# Patient Record
Sex: Female | Born: 1955
Health system: Southern US, Community
[De-identification: ages and names within clinical notes are randomized; demographics above are authoritative.]

## PROBLEM LIST (undated history)

## (undated) DIAGNOSIS — D680 Von Willebrand's disease: Secondary | ICD-10-CM

## (undated) DIAGNOSIS — M810 Age-related osteoporosis without current pathological fracture: Secondary | ICD-10-CM

## (undated) DIAGNOSIS — E079 Disorder of thyroid, unspecified: Secondary | ICD-10-CM

## (undated) DIAGNOSIS — M5126 Other intervertebral disc displacement, lumbar region: Secondary | ICD-10-CM

## (undated) DIAGNOSIS — D649 Anemia, unspecified: Secondary | ICD-10-CM

## (undated) DIAGNOSIS — S42009A Fracture of unspecified part of unspecified clavicle, initial encounter for closed fracture: Secondary | ICD-10-CM

## (undated) DIAGNOSIS — F419 Anxiety disorder, unspecified: Secondary | ICD-10-CM

## (undated) DIAGNOSIS — F32A Depression, unspecified: Secondary | ICD-10-CM

## (undated) DIAGNOSIS — G43909 Migraine, unspecified, not intractable, without status migrainosus: Secondary | ICD-10-CM

## (undated) DIAGNOSIS — S92902A Unspecified fracture of left foot, initial encounter for closed fracture: Secondary | ICD-10-CM

## (undated) DIAGNOSIS — M1610 Unilateral primary osteoarthritis, unspecified hip: Secondary | ICD-10-CM

## (undated) DIAGNOSIS — F329 Major depressive disorder, single episode, unspecified: Secondary | ICD-10-CM

## (undated) DIAGNOSIS — T7840XA Allergy, unspecified, initial encounter: Secondary | ICD-10-CM

## (undated) DIAGNOSIS — R87619 Unspecified abnormal cytological findings in specimens from cervix uteri: Secondary | ICD-10-CM

## (undated) HISTORY — DX: Disorder of thyroid, unspecified: E07.9

## (undated) HISTORY — PX: TONSILLECTOMY AND ADENOIDECTOMY: SHX28

## (undated) HISTORY — DX: Anemia, unspecified: D64.9

## (undated) HISTORY — DX: Unspecified fracture of left foot, initial encounter for closed fracture: S92.902A

## (undated) HISTORY — DX: Major depressive disorder, single episode, unspecified: F32.9

## (undated) HISTORY — DX: Fracture of unspecified part of unspecified clavicle, initial encounter for closed fracture: S42.009A

## (undated) HISTORY — DX: Age-related osteoporosis without current pathological fracture: M81.0

## (undated) HISTORY — DX: Allergy, unspecified, initial encounter: T78.40XA

## (undated) HISTORY — DX: Unilateral primary osteoarthritis, unspecified hip: M16.10

## (undated) HISTORY — DX: Unspecified abnormal cytological findings in specimens from cervix uteri: R87.619

## (undated) HISTORY — DX: Migraine, unspecified, not intractable, without status migrainosus: G43.909

## (undated) HISTORY — DX: Von Willebrand's disease: D68.0

## (undated) HISTORY — DX: Depression, unspecified: F32.A

## (undated) HISTORY — DX: Other intervertebral disc displacement, lumbar region: M51.26

---

## 1994-03-28 HISTORY — PX: TMJ ARTHROSCOPY: SHX1067

## 2004-09-25 DIAGNOSIS — D649 Anemia, unspecified: Secondary | ICD-10-CM

## 2004-09-25 HISTORY — DX: Anemia, unspecified: D64.9

## 2005-03-28 DIAGNOSIS — D6800 Von Willebrand disease, unspecified: Secondary | ICD-10-CM

## 2005-03-28 HISTORY — PX: LEEP: SHX91

## 2005-03-28 HISTORY — DX: Von Willebrand's disease: D68.0

## 2005-03-28 HISTORY — DX: Von Willebrand disease, unspecified: D68.00

## 2005-06-26 DIAGNOSIS — R87619 Unspecified abnormal cytological findings in specimens from cervix uteri: Secondary | ICD-10-CM

## 2005-06-26 HISTORY — DX: Unspecified abnormal cytological findings in specimens from cervix uteri: R87.619

## 2006-02-25 DIAGNOSIS — M810 Age-related osteoporosis without current pathological fracture: Secondary | ICD-10-CM

## 2006-02-25 HISTORY — DX: Age-related osteoporosis without current pathological fracture: M81.0

## 2006-02-28 DIAGNOSIS — M810 Age-related osteoporosis without current pathological fracture: Secondary | ICD-10-CM | POA: Insufficient documentation

## 2006-05-24 DIAGNOSIS — R519 Headache, unspecified: Secondary | ICD-10-CM | POA: Insufficient documentation

## 2006-11-27 DIAGNOSIS — S92902A Unspecified fracture of left foot, initial encounter for closed fracture: Secondary | ICD-10-CM

## 2006-11-27 HISTORY — DX: Unspecified fracture of left foot, initial encounter for closed fracture: S92.902A

## 2007-01-27 DIAGNOSIS — E079 Disorder of thyroid, unspecified: Secondary | ICD-10-CM

## 2007-01-27 HISTORY — DX: Disorder of thyroid, unspecified: E07.9

## 2007-07-30 DIAGNOSIS — E0590 Thyrotoxicosis, unspecified without thyrotoxic crisis or storm: Secondary | ICD-10-CM | POA: Insufficient documentation

## 2008-07-14 DIAGNOSIS — N952 Postmenopausal atrophic vaginitis: Secondary | ICD-10-CM | POA: Insufficient documentation

## 2012-02-04 ENCOUNTER — Ambulatory Visit (INDEPENDENT_AMBULATORY_CARE_PROVIDER_SITE_OTHER): Payer: 59 | Admitting: Family Medicine

## 2012-02-04 VITALS — BP 122/78 | HR 84 | Temp 98.1°F | Resp 17 | Ht 65.0 in | Wt 183.0 lb

## 2012-02-04 DIAGNOSIS — R519 Headache, unspecified: Secondary | ICD-10-CM

## 2012-02-04 DIAGNOSIS — M816 Localized osteoporosis [Lequesne]: Secondary | ICD-10-CM | POA: Insufficient documentation

## 2012-02-04 DIAGNOSIS — N390 Urinary tract infection, site not specified: Secondary | ICD-10-CM

## 2012-02-04 DIAGNOSIS — M65342 Trigger finger, left ring finger: Secondary | ICD-10-CM

## 2012-02-04 DIAGNOSIS — Z0000 Encounter for general adult medical examination without abnormal findings: Secondary | ICD-10-CM

## 2012-02-04 DIAGNOSIS — Z1231 Encounter for screening mammogram for malignant neoplasm of breast: Secondary | ICD-10-CM

## 2012-02-04 DIAGNOSIS — M6530 Trigger finger, unspecified finger: Secondary | ICD-10-CM

## 2012-02-04 DIAGNOSIS — M810 Age-related osteoporosis without current pathological fracture: Secondary | ICD-10-CM

## 2012-02-04 LAB — POCT UA - MICROSCOPIC ONLY
Casts, Ur, LPF, POC: NEGATIVE
Yeast, UA: NEGATIVE

## 2012-02-04 LAB — LIPID PANEL
Total CHOL/HDL Ratio: 3.4 Ratio
VLDL: 17 mg/dL (ref 0–40)

## 2012-02-04 LAB — COMPREHENSIVE METABOLIC PANEL
CO2: 27 mEq/L (ref 19–32)
Creat: 0.93 mg/dL (ref 0.50–1.10)
Glucose, Bld: 87 mg/dL (ref 70–99)
Total Bilirubin: 0.7 mg/dL (ref 0.3–1.2)
Total Protein: 7.3 g/dL (ref 6.0–8.3)

## 2012-02-04 LAB — POCT URINALYSIS DIPSTICK
Bilirubin, UA: NEGATIVE
Leukocytes, UA: NEGATIVE
Nitrite, UA: POSITIVE
pH, UA: 6

## 2012-02-04 LAB — POCT CBC
HCT, POC: 44.3 % (ref 37.7–47.9)
Hemoglobin: 13.2 g/dL (ref 12.2–16.2)
Lymph, poc: 1.9 (ref 0.6–3.4)
MCH, POC: 26.3 pg — AB (ref 27–31.2)
MCHC: 29.8 g/dL — AB (ref 31.8–35.4)
MCV: 88.4 fL (ref 80–97)
WBC: 4.8 10*3/uL (ref 4.6–10.2)

## 2012-02-04 LAB — HEPATITIS C ANTIBODY: HCV Ab: NEGATIVE

## 2012-02-04 LAB — TSH: TSH: 0.722 u[IU]/mL (ref 0.350–4.500)

## 2012-02-04 MED ORDER — NITROFURANTOIN MONOHYD MACRO 100 MG PO CAPS: 100.0000 mg | ORAL_CAPSULE | Freq: Two times a day (BID) | ORAL | Status: DC

## 2012-02-04 NOTE — Patient Instructions (Addendum)
Trigger Finger Trigger finger (digital tendinitis and stenosing tenosynovitis) is a common disorder that causes an often painful catching of the fingers or thumb. It occurs as a clicking, snapping or locking of a finger in the palm of the hand. The reason for this is that there is a problem with the tendons which flex the fingers sliding smoothly through their sheaths. The cause of this may be inflammation of the tendon and sheath, or from a thickening or nodule in the tendon. The condition may occur in any finger or a couple fingers at the same time. The cause may be overuse while doing the same activity over and over again with your hands.  Tendons are the tough cords that connect the muscles to bones. Muscles and tendons are part of the system which allows your body to move. When muscles contract in the forearm on the palm side, they pull the tendons toward the elbow and cause the fingers and thumb to bend (flex) toward the palm. These are the flexor tendons. The tendons slide through a slippery smooth membrane (synovium) which is called the tendon sheath. The sheaths have areas of tough fibrous tissues surrounding them which hold the tendons close to the bone. These are called pulleys because they work like a pulley. The first pulley is in the palm of the hand near the crease which runs across your palm. If the area of the tendon thickening is near the pulley, the tendon cannot slide smoothly through the pulley and this causes the trigger finger. The finger may lock with the finger curled or suddenly straighten out with a snap. This is more common in patients with rheumatoid arthritis and diabetes. Left untreated, the condition may get worse to the point where the finger becomes locked in flexion, like making a fist, or less commonly locked with the finger straightened out. DIAGNOSIS  Your caregiver will easily make this diagnosis on examination. TREATMENT   Splinting for 6 to 8 weeks of time may be  helpful. Use the splints as your caregiver suggests.  Heat used for twenty minutes at least four times a day followed by ice packs for twenty minutes unless directed otherwise by your caregiver may be helpful. If you find either heat or cold seems to be making the problem worse, quit using them and ask your caregiver for directions.  Cortisone injections along with splinting may speed up recovery. Several injections may be required. Cortisone may give relief after one injection.  Only take over-the-counter or prescription medicines for pain, discomfort, or fever as directed by your caregiver.  Surgery is another treatment that may be used if conservative treatments using injection and splinting does not work. Surgery can be minor without incisions (a cut does not have to be made) and can be done with a needle through the skin. No stitches are needed and most patients may return to work the same day.  Other surgical choices involve an open procedure where the surgeon opens the hand through a small incision (cut) and cuts the pulley so the tendon can again slide smoothly. Your hand will still work fine. This small operation requires stitches and the recovery will be a little longer and the incisions will need to be protected until completely healed. You may have to limit your activities for up to 6 months.  Occupational or hand therapy may be required if there is stiffness remaining in the finger. RISKS AND COMPLICATIONS Complications are uncommon but some problems that may occur are:    Recurrence of the trigger finger. This does not mean that the surgery was not well done. It simply means that you may have formed scar tissue following surgery that causes the problem to reoccur.  Infection which could ruin the results of the surgery and can result in a finger which is frozen and can not move normally.  Nerve injury is possible which could result in permanent numbness of one or more fingers. CARE  AFTER SURGERY  Elevate your hand above your heart and use ice as instructed.  Follow instructions regarding finger motion/exercise.  Keep the surgical wound dry for at least 48 hrs or longer if instructed.  Keep your follow-up appointments.  Return to work and normal activities as instructed. SEEK IMMEDIATE MEDICAL CARE IF:  Your problems are getting worse or you do not obtain relief from the treatment. Document Released: 01/02/2004 Document Revised: 06/06/2011 Document Reviewed: 08/26/2008 Exit[employer] Patient Information 2013 [employer].  

## 2012-02-04 NOTE — Progress Notes (Signed)
Subjective:    Patient ID: Haley Buchanan, female    DOB: 07/22/1955, 56 y.o.   MRN: [id number]  HPI All other records in [place] - hasn't had health ins in 2 yrs - moved here about a yr ago.  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   Does have a h/o abnml paps recurrently when she was hosp inpt for procedure w/ top of cervix removed w/ anesthesia. All paps nml since thin 6 yrs prev and last pap was 3 yrs Prev diagnosed w/ osteoporosis 5-6 yrs, been about 2 yrs since last dexa. Was on fosamax but got off of it when the scare about increase - on bone matrix supplement Due to mammogram - thinks she maybe had one callback just for repeat imaging but all others normal. Has had 2 colonoscopies - 1 or 2 small polyps the first but second was nml - last was about 5 yrs ago. MOther has had a heart valve replaced - first developed issues at 12-76 yo. Is fasting Is interested in a weight loss program - prev did it with jogging and counting calories but now w/ joint pain.  Bruises easily and diagnosed w/ von Willebrands but won't cause problems unless she has surgery Post-menopausal x 3 yrs. On calcium otc w/ vit D tries to take bid Has a very sore left 4th finger that gets stuck bent  H/o treatment for chronic HAs and was on topamax for awhile but had terrible memory problems on this - thought she was going dyslesic and crazy so was seeing pysch who started her on strattera.  Was also on celebrex, called migraines by neuro w/ photophobia and phonophobia - does not remember being tried on bp meds - tried on 6-7 different maintanence meds in addition to prn. excedrin works the best but she has to watch about rebound HAs. Very short term memory still difficult - attributes this to the topamax Past Medical History  Diagnosis Date  . Allergy   . Anemia   . Depression   . Osteoporosis   . Osteoporosis   . Clotting disorder     possibly von Willebrands - just causes easy  bleeding and caution w/ surgery   Past Surgical History  Procedure Date  . Tmj arthroscopy    Family History  Problem Relation Age of Onset  . Osteoporosis Mother   . Heart Problems Mother   . Cancer Father   . Ulcers Father   . Osteoporosis Maternal Grandmother   . Alzheimer's disease Maternal Grandfather    History   Social History  . Marital Status: Divorced    Spouse Name: N/A    Number of Children: N/A  . Years of Education: N/A   Occupational History  . Not on file.   Social History Main Topics  . Smoking status: Never Smoker   . Smokeless tobacco: Never Used  . Alcohol Use: 1.2 oz/week    2 Glasses of wine per week  . Drug Use: No  . Sexually Active: No   Other Topics Concern  . Not on file   Social History Narrative  . No narrative on file   No current outpatient prescriptions on file prior to visit.   No Known Allergies   Review of Systems  Constitutional: Positive for unexpected weight change. Negative for fever, chills, diaphoresis, activity change, appetite change and fatigue.  HENT: Positive for neck pain, neck stiffness and sinus pressure. Negative for hearing loss, ear pain, nosebleeds,  congestion, sore throat, facial swelling, rhinorrhea, sneezing, drooling, mouth sores, trouble swallowing, dental problem, voice change, postnasal drip, tinnitus and ear discharge.   Eyes: Negative for photophobia, pain, discharge, redness, itching and visual disturbance.  Respiratory: Negative for apnea, cough, choking, chest tightness, shortness of breath, wheezing and stridor.   Cardiovascular: Positive for palpitations. Negative for chest pain and leg swelling.  Gastrointestinal: Positive for anal bleeding. Negative for nausea, vomiting, abdominal pain, diarrhea, constipation, blood in stool, abdominal distention and rectal pain.  Genitourinary: Negative for dysuria, urgency, frequency, hematuria, flank pain, decreased urine volume, vaginal bleeding, vaginal  discharge, enuresis, difficulty urinating, genital sores, vaginal pain, menstrual problem, pelvic pain and dyspareunia.  Musculoskeletal: Positive for arthralgias. Negative for myalgias, back pain, joint swelling and gait problem.  Skin: Negative for color change, pallor, rash and wound.  Neurological: Positive for headaches. Negative for dizziness, tremors, seizures, syncope, facial asymmetry, speech difficulty, weakness, light-headedness and numbness.  Hematological: Negative for adenopathy. Does not bruise/bleed easily.  Psychiatric/Behavioral: Positive for decreased concentration. Negative for suicidal ideas, hallucinations, behavioral problems, confusion, sleep disturbance, self-injury, dysphoric mood and agitation. The patient is not nervous/anxious and is not hyperactive.         BP 122/78  Pulse 84  Temp 98.1 F (36.7 C) (Oral)  Resp 17  Ht 5\' 5"  (1.651 m)  Wt 183 lb (83.008 kg)  BMI 30.45 kg/m2  SpO2 98% Objective:   Physical Exam  Constitutional: She is oriented to person, place, and time. She appears well-developed and well-nourished. No distress.  HENT:  Head: Normocephalic and atraumatic.  Right Ear: Tympanic membrane, external ear and ear canal normal.  Left Ear: Tympanic membrane, external ear and ear canal normal.  Nose: No mucosal edema or rhinorrhea.  Mouth/Throat: Uvula is midline, oropharynx is clear and moist and mucous membranes are normal. No posterior oropharyngeal erythema.  Eyes: Conjunctivae normal and EOM are normal. Pupils are equal, round, and reactive to light. Right eye exhibits no discharge. Left eye exhibits no discharge. No scleral icterus.  Neck: Normal range of motion. Neck supple. No thyromegaly present.  Cardiovascular: Normal rate, regular rhythm, normal heart sounds and intact distal pulses.   Pulmonary/Chest: Effort normal and breath sounds normal. No respiratory distress.  Abdominal: Soft. Bowel sounds are normal. There is no tenderness.    Genitourinary: Vagina normal and uterus normal. No breast swelling, tenderness, discharge or bleeding. Cervix exhibits no motion tenderness and no friability. Right adnexum displays no mass and no tenderness. Left adnexum displays no mass and no tenderness.  Musculoskeletal: She exhibits no edema.       Left hand: She exhibits bony tenderness.       Tender over 4th MCP joint, palmer aspect.   Lymphadenopathy:    She has no cervical adenopathy.  Neurological: She is alert and oriented to person, place, and time. She has normal reflexes.  Skin: Skin is warm and dry. She is not diaphoretic. No erythema.  Psychiatric: She has a normal mood and affect. Her behavior is normal.   Results for orders placed in visit on 02/04/12  POCT CBC      Component Value Range   WBC 4.8  4.6 - 10.2 K/uL   Lymph, poc 1.9  0.6 - 3.4   POC LYMPH PERCENT 38.7  10 - 50 %L   MID (cbc) 0.3  0 - 0.9   POC MID % 5.3  0 - 12 %M   POC Granulocyte 2.7  2 - 6.9   Granulocyte  percent 56.0  37 - 80 %G   RBC 5.01  4.04 - 5.48 M/uL   Hemoglobin 13.2  12.2 - 16.2 g/dL   HCT, POC 45.4  09.8 - 47.9 %   MCV 88.4  80 - 97 fL   MCH, POC 26.3 (*) 27 - 31.2 pg   MCHC 29.8 (*) 31.8 - 35.4 g/dL   RDW, POC 11.9     Platelet Count, POC 300  142 - 424 K/uL   MPV 8.6  0 - 99.8 fL  POCT URINALYSIS DIPSTICK      Component Value Range   Color, UA yellow     Clarity, UA cloudy     Glucose, UA neg     Bilirubin, UA neg     Ketones, UA neg     Spec Grav, UA 1.020     Blood, UA trace     pH, UA 6.0     Protein, UA neg     Urobilinogen, UA 0.2     Nitrite, UA pos     Leukocytes, UA Negative    POCT UA - MICROSCOPIC ONLY      Component Value Range   WBC, Ur, HPF, POC 0-1     RBC, urine, microscopic 0-1     Bacteria, U Microscopic 3+     Mucus, UA neg     Epithelial cells, urine per micros 0-1     Crystals, Ur, HPF, POC neg     Casts, Ur, LPF, POC neg     Yeast, UA neg        Assessment & Plan:   1. Annual physical exam   Lipid panel, POCT CBC, Comprehensive metabolic panel, TSH, POCT urinalysis dipstick, POCT UA - Microscopic Only, Hepatitis C antibody, Pap IG w/ reflex to HPV when ASC-U, MM Digital Screening, Ambulatory referral to Gastroenterology for colonoscopy, Urine culture  2. Osteoporosis  DG Bone Density - obtain prior. If no sig change, fine to cont w/o meds but if progressing will have to reconsider bisphosphonate therapy vs other  3. Headache, chronic daily  Get prior neuro recs as she has been tried on so many preventative meds before but she would like to try another but will have to find out what she has been on and how she responded first.  4. Trigger ring finger of left hand  Gave splint to wear along w/ buddy taping w/ ace wrap every night for 6-8 wks, cont ice. If continues, RTC to consider referral to hand surgeon for injection or surg.  5. Urinary tract infection  Clx pending. nitrofurantoin, macrocrystal-monohydrate, (MACROBID) 100 MG capsule  6. Memory problems - get prior psych records, consider MMSE at f/u at least for baseline. 7. Obesity - pt will investigate weight loss programs through [employer] systems, e.g.)

## 2012-02-06 LAB — PAP IG W/ RFLX HPV ASCU

## 2012-02-06 LAB — URINE CULTURE

## 2012-02-10 ENCOUNTER — Encounter: Payer: Self-pay | Admitting: Family Medicine

## 2012-02-11 ENCOUNTER — Other Ambulatory Visit: Payer: 59

## 2012-02-11 ENCOUNTER — Other Ambulatory Visit: Payer: Self-pay | Admitting: Family Medicine

## 2012-02-11 DIAGNOSIS — Z0000 Encounter for general adult medical examination without abnormal findings: Secondary | ICD-10-CM

## 2012-03-20 ENCOUNTER — Ambulatory Visit: Payer: Self-pay

## 2012-03-20 ENCOUNTER — Other Ambulatory Visit: Payer: Self-pay

## 2012-04-23 ENCOUNTER — Ambulatory Visit
Admission: RE | Admit: 2012-04-23 | Discharge: 2012-04-23 | Disposition: A | Discharge status: Home / Self Care | Payer: 59 | Source: Ambulatory Visit | Attending: Family Medicine | Admitting: Family Medicine

## 2012-04-23 DIAGNOSIS — Z1231 Encounter for screening mammogram for malignant neoplasm of breast: Secondary | ICD-10-CM

## 2012-04-23 DIAGNOSIS — M810 Age-related osteoporosis without current pathological fracture: Secondary | ICD-10-CM

## 2012-04-28 ENCOUNTER — Encounter: Payer: Self-pay | Admitting: Family Medicine

## 2012-05-14 ENCOUNTER — Other Ambulatory Visit: Payer: Self-pay | Admitting: Family Medicine

## 2012-05-14 ENCOUNTER — Other Ambulatory Visit: Payer: Self-pay | Admitting: Radiology

## 2012-05-14 DIAGNOSIS — R928 Other abnormal and inconclusive findings on diagnostic imaging of breast: Secondary | ICD-10-CM

## 2012-07-24 ENCOUNTER — Other Ambulatory Visit: Payer: Self-pay | Admitting: Gastroenterology

## 2012-08-09 ENCOUNTER — Telehealth: Payer: Self-pay

## 2012-08-09 NOTE — Telephone Encounter (Signed)
Is she referring to the Dexa? This was in Nov 2013. Results as follows:. ASSESSMENT: Patient's diagnostic category is OSTEOPOROSIS by WHO Criteria. FRACTURE RISK: INCREASED

## 2012-08-09 NOTE — Telephone Encounter (Signed)
Patient was referred out for a bone scan and would like to know the results of those. [phone].

## 2012-08-09 NOTE — Telephone Encounter (Signed)
Called her left message for her to call back

## 2012-08-09 NOTE — Telephone Encounter (Signed)
She is advised of the bone density results. She wants to know what she can do regarding osteoporosis. She asked why she has not gotten notification of results/ apologized to her advised I am not sure how she did not get notified of the results please advise.

## 2012-08-13 ENCOUNTER — Telehealth: Payer: Self-pay

## 2012-08-13 ENCOUNTER — Ambulatory Visit (INDEPENDENT_AMBULATORY_CARE_PROVIDER_SITE_OTHER): Payer: 59 | Admitting: Family Medicine

## 2012-08-13 VITALS — BP 110/78 | HR 75 | Temp 98.3°F | Resp 17 | Ht 64.5 in | Wt 159.0 lb

## 2012-08-13 DIAGNOSIS — K648 Other hemorrhoids: Secondary | ICD-10-CM

## 2012-08-13 DIAGNOSIS — R300 Dysuria: Secondary | ICD-10-CM

## 2012-08-13 DIAGNOSIS — M810 Age-related osteoporosis without current pathological fracture: Secondary | ICD-10-CM

## 2012-08-13 DIAGNOSIS — IMO0002 Reserved for concepts with insufficient information to code with codable children: Secondary | ICD-10-CM

## 2012-08-13 DIAGNOSIS — S30814A Abrasion of vagina and vulva, initial encounter: Secondary | ICD-10-CM

## 2012-08-13 DIAGNOSIS — N816 Rectocele: Secondary | ICD-10-CM

## 2012-08-13 DIAGNOSIS — N390 Urinary tract infection, site not specified: Secondary | ICD-10-CM

## 2012-08-13 DIAGNOSIS — K5900 Constipation, unspecified: Secondary | ICD-10-CM

## 2012-08-13 LAB — POCT URINALYSIS DIPSTICK
Ketones, UA: NEGATIVE
Protein, UA: NEGATIVE
Urobilinogen, UA: 0.2
pH, UA: 7

## 2012-08-13 LAB — POCT UA - MICROSCOPIC ONLY
Casts, Ur, LPF, POC: NEGATIVE
Crystals, Ur, HPF, POC: NEGATIVE
Yeast, UA: NEGATIVE

## 2012-08-13 LAB — POCT GLYCOSYLATED HEMOGLOBIN (HGB A1C): Hemoglobin A1C: 5.1

## 2012-08-13 MED ORDER — ESTROGENS, CONJUGATED 0.625 MG/GM VA CREA: TOPICAL_CREAM | VAGINAL | Status: DC

## 2012-08-13 MED ORDER — NITROFURANTOIN MONOHYD MACRO 100 MG PO CAPS: 100.0000 mg | ORAL_CAPSULE | Freq: Two times a day (BID) | ORAL | Status: DC

## 2012-08-13 MED ORDER — HYDROCORTISONE ACETATE 25 MG RE SUPP: 25.0000 mg | Freq: Two times a day (BID) | RECTAL | Status: DC

## 2012-08-13 MED ORDER — ESTROGENS, CONJUGATED 0.625 MG/GM VA CREA: TOPICAL_CREAM | Freq: Every day | VAGINAL | Status: DC

## 2012-08-13 NOTE — Telephone Encounter (Signed)
Pt seen in office today.

## 2012-08-13 NOTE — Telephone Encounter (Signed)
Pharm faxed request to clarify dosage on Premarin cream. Dr Clelia Croft can you please send in new Rx w/dosage?

## 2012-08-13 NOTE — Progress Notes (Signed)
Subjective:    Patient ID: Haley Buchanan, female    DOB: 09/24/1955, 57 y.o.   MRN: [id number] Chief Complaint  Patient presents with  . Urinary Tract Infection  . Urinary Frequency   HPI  Haley Buchanan is a pleasant 57 yo woman who has been having urinary incontinence and dribbling for the past several weeks.  Feels the urge to void but then can't get anything out.  Initially thought it was all the water and coffee she was drinking but then she did have burning for a day only.    Has been having trouble with hemorrhoids and has been using an otc generic preparation H product with minimal relief.  Has been struggling with constipation at times despite trying to get more fiber.  Feces has been hard balls.  Bran cereal, more fresh fruit and veggies which did help. Occ has bright red blood on the outside of her stools.  Has colonoscopy with Dr. Loreta Ave scheduled in July.  Not using any products to treat the constipation  Was diagnosed w/ a rectocele about 6-8 yrs ago in [place] - however, it seems to spontaneously resolve on its own and never gave her any problems until the past several weeks. Now she feels like her BM get caught and pushes into vagina.  She has to place her fingers into her vagina and push the stool out so she can pass it.  Taker her Calcium and vit D.  Taking a bone supplement - bone complex from health food store. Did have 2 dexas prior at the [facility]. Was on daily fosamax but became worried about the spiral hip fractures and class action lawsuits connected to this.  Would rather treat it naturally but would be open to restarting or going on other med to actually build up her bones if necessary.  Has been trying to jog - but causes her knees to hurt.  Has lost 20 lbs in the past 6 mos - has been working very hard on weight loss and exercise.  Taking glucosamine/chrondroiton w/o relief.  +FHx of osteoporosis in mother and grandmother.  Past Medical History  Diagnosis Date  .  Allergy   . Anemia   . Depression     Last psychiatrist - Dr. Melrose Nakayama - does not have records as seen prior to 2007  . Osteoporosis     DEXA 04/23/12 w/ T score L femur -2.8 and L spine -1.4  . Von Willebrand's disease    No current outpatient prescriptions on file prior to visit.   No current facility-administered medications on file prior to visit.   No Known Allergies Family History  Problem Relation Age of Onset  . Osteoporosis Mother   . Heart Problems Mother   . Cancer Father   . Ulcers Father   . Osteoporosis Maternal Grandmother   . Alzheimer's disease Maternal Grandfather   . Depression Daughter     Review of Systems  Constitutional: Negative for fever, chills, diaphoresis, activity change, appetite change, fatigue and unexpected weight change.  Gastrointestinal: Positive for constipation. Negative for nausea, vomiting, abdominal pain, diarrhea, blood in stool, abdominal distention, anal bleeding and rectal pain.  Genitourinary: Positive for urgency, frequency, decreased urine volume, difficulty urinating and genital sores. Negative for dysuria, hematuria, flank pain, vaginal bleeding, vaginal discharge, enuresis, vaginal pain, menstrual problem and pelvic pain.  Musculoskeletal: Positive for arthralgias. Negative for joint swelling and gait problem.  Skin: Negative for rash.  Hematological: Negative for adenopathy.  Psychiatric/Behavioral: The patient  is not nervous/anxious.       BP 110/78  Pulse 75  Temp(Src) 98.3 F (36.8 C) (Oral)  Resp 17  Ht 5' 4.5" (1.638 m)  Wt 159 lb (72.122 kg)  BMI 26.88 kg/m2  SpO2 99% Objective:   Physical Exam  Constitutional: She is oriented to person, place, and time. She appears well-developed and well-nourished. No distress.  HENT:  Head: Normocephalic and atraumatic.  Right Ear: External ear normal.  Left Ear: External ear normal.  Eyes: Conjunctivae are normal. No scleral icterus.  Neck: Normal range of motion. Neck  supple. No thyromegaly present.  Cardiovascular: Normal rate, regular rhythm, normal heart sounds and intact distal pulses.   Pulmonary/Chest: Effort normal and breath sounds normal. No respiratory distress.  Genitourinary: Rectal exam shows internal hemorrhoid. Rectal exam shows no external hemorrhoid, no fissure, no mass, no tenderness and anal tone normal. Guaiac negative stool. No labial fusion. There is no rash, tenderness or lesion on the right labia. There is tenderness and lesion on the left labia.  Prominent urethra with erythema. Labial minora mucosa thinned with some small areas of increased erythema. Large rectal vault with few pieces of hard stool  Musculoskeletal: She exhibits no edema.  Lymphadenopathy:    She has no cervical adenopathy.  Neurological: She is alert and oriented to person, place, and time.  Skin: Skin is warm and dry. She is not diaphoretic. No erythema.  Psychiatric: She has a normal mood and affect. Her behavior is normal.      Results for orders placed in visit on 08/13/12  POCT UA - MICROSCOPIC ONLY      Result Value Range   WBC, Ur, HPF, POC 6-8     RBC, urine, microscopic 0-1     Bacteria, U Microscopic 3+     Mucus, UA neg     Epithelial cells, urine per micros 0-1     Crystals, Ur, HPF, POC neg     Casts, Ur, LPF, POC neg     Yeast, UA neg    POCT URINALYSIS DIPSTICK      Result Value Range   Color, UA yellow     Clarity, UA sl cloudy     Glucose, UA neg     Bilirubin, UA neg     Ketones, UA neg     Spec Grav, UA 1.015     Blood, UA trace-intact     pH, UA 7.0     Protein, UA neg     Urobilinogen, UA 0.2     Nitrite, UA postive     Leukocytes, UA small (1+)    IFOBT (OCCULT BLOOD)      Result Value Range   IFOBT Negative      Assessment & Plan:  Burning with urination/ UTI- Plan: POCT UA - Microscopic Only, POCT urinalysis dipstick, Urine culture - Pt currently has UTI so will cover w/ macrobid but i suspect that her prolonged sxs  may be more due to her urethral irritation.  Will have pt use topical estrogen to urethrea area - small 1/2 pea-sized amount bid x 1 wk, then qd x 1 wk, then 3x/wk.  F/u w/ gyn for further directions.  Osteoporosis, unspecified - Plan: POCT glycosylated hemoglobin (Hb A1C), Vitamin D 25 hydroxy - again had pt sign and faxed release to get old dexa scans for comparison.  Pt was really hoping she can maintain her bone density through weight bearing exercises and supplements but if not, would probably  be interested in something that can help her actively build bone mass - like reclast - consider referral to endocrine or rheumatology to discuss further w/ pt.  Rectocele - Plan: Ambulatory referral to Gynecology - [place] -   Unspecified constipation - Plan: IFOBT POC (occult bld, rslt in office) - start bid fiber supplement. If she does not see any improvement, try miralax.  Internal hemorrhoids - try suppository after BM. Has appt w/ dr. Loreta Ave for colonoscopy in about 2 months.  Try to resolve constipation  Labial abrasion, initial encounter - due to mucosal thinning. Start topical estrogen cream to inner labia minora as described above.   Meds ordered this encounter  Medications  . conjugated estrogens (PREMARIN) vaginal cream    Sig: Place vaginally daily.    Dispense:  42.5 g    Refill:  12  . hydrocortisone (ANUSOL-HC) 25 MG suppository    Sig: Place 1 suppository (25 mg total) rectally 2 (two) times daily.    Dispense:  12 suppository    Refill:  0  . nitrofurantoin, macrocrystal-monohydrate, (MACROBID) 100 MG capsule    Sig: Take 1 capsule (100 mg total) by mouth 2 (two) times daily.    Dispense:  14 capsule    Refill:  0

## 2012-08-13 NOTE — Addendum Note (Signed)
Addended by: Norberto Sorenson on: 08/13/2012 06:31 PM   Modules accepted: Orders

## 2012-08-13 NOTE — Telephone Encounter (Signed)
done

## 2012-08-13 NOTE — Patient Instructions (Addendum)
Start a twice daily fiber supplement before your two largest meals of the day to bulk up your stools.  If this does not work, transition to [employer] instead.

## 2012-08-14 ENCOUNTER — Telehealth: Payer: Self-pay

## 2012-08-14 LAB — VITAMIN D 25 HYDROXY (VIT D DEFICIENCY, FRACTURES): Vit D, 25-Hydroxy: 36 ng/mL (ref 30–89)

## 2012-08-14 NOTE — Telephone Encounter (Signed)
Was speaking to pt about labs and she said MR tried to call about some records (??) Please try again tomorrow. Thanks

## 2012-08-15 LAB — URINE CULTURE: Colony Count: >=100000

## 2012-08-15 NOTE — Telephone Encounter (Signed)
Spoke with patient. The location we are receiving records from is the [facility] in [place], [place] on [address]. Its the big location , not the smaller office. Release of information faxed.

## 2012-08-28 ENCOUNTER — Encounter: Payer: Self-pay | Admitting: Family Medicine

## 2012-09-09 ENCOUNTER — Telehealth: Payer: Self-pay | Admitting: Family Medicine

## 2012-09-09 DIAGNOSIS — M810 Age-related osteoporosis without current pathological fracture: Secondary | ICD-10-CM

## 2012-09-09 NOTE — Telephone Encounter (Signed)
I finally received a copy of pt's prev DEXA scan from 01/2008 and last DEXA scan sig worsened.  In 2009 L spine t score is -1.0 and now it is -1.4.  In the right femur om 2009 t score was - 2.3 and now it is -2.8.  Therefore since 2009, she has progressed from osteopenia to osteoporosis. Because she is so young, I highly recommend that she start on treatment - recommend referral to either rheumatology or endocrinology for further treatment of her osteoporosis to discuss using a medicine that actually builds bone. Please call pt with above info and place referral if she wishes. Thanks.

## 2012-09-10 NOTE — Telephone Encounter (Signed)
lmom to cb. 

## 2012-09-11 ENCOUNTER — Telehealth: Payer: Self-pay

## 2012-09-11 NOTE — Telephone Encounter (Signed)
Left message for patient to call back  

## 2012-09-11 NOTE — Telephone Encounter (Signed)
Pt is returning our call and was not sure who called or what it was about   Best number [phone]

## 2012-09-12 NOTE — Telephone Encounter (Signed)
Patient is returning our phone from earlier.  Best#: [phone]

## 2012-09-12 NOTE — Telephone Encounter (Signed)
Called pt, she has message already in pool.

## 2012-09-12 NOTE — Telephone Encounter (Signed)
error 

## 2012-09-12 NOTE — Telephone Encounter (Signed)
Pt notified and would like for you to do the referral

## 2012-09-12 NOTE — Telephone Encounter (Signed)
Ok, please refer for worsening osteoporosis,  rheumatology should be fine.

## 2012-09-13 NOTE — Telephone Encounter (Signed)
Thanks, referral made

## 2012-09-24 DIAGNOSIS — R350 Frequency of micturition: Secondary | ICD-10-CM | POA: Insufficient documentation

## 2012-09-24 DIAGNOSIS — M810 Age-related osteoporosis without current pathological fracture: Secondary | ICD-10-CM

## 2012-10-15 ENCOUNTER — Ambulatory Visit (HOSPITAL_COMMUNITY)
Admission: RE | Admit: 2012-10-15 | Discharge: 2012-10-15 | Disposition: A | Discharge status: Home / Self Care | Payer: 59 | Source: Ambulatory Visit | Attending: Gastroenterology | Admitting: Gastroenterology

## 2012-10-15 ENCOUNTER — Encounter (HOSPITAL_COMMUNITY)
Admission: RE | Disposition: A | Discharge status: Home / Self Care | Payer: Self-pay | Source: Ambulatory Visit | Attending: Gastroenterology

## 2012-10-15 DIAGNOSIS — Z860100 Personal history of colon polyps, unspecified: Secondary | ICD-10-CM | POA: Insufficient documentation

## 2012-10-15 DIAGNOSIS — D6800 Von Willebrand disease, unspecified: Secondary | ICD-10-CM | POA: Insufficient documentation

## 2012-10-15 DIAGNOSIS — M810 Age-related osteoporosis without current pathological fracture: Secondary | ICD-10-CM | POA: Insufficient documentation

## 2012-10-15 DIAGNOSIS — K648 Other hemorrhoids: Secondary | ICD-10-CM | POA: Insufficient documentation

## 2012-10-15 DIAGNOSIS — Z09 Encounter for follow-up examination after completed treatment for conditions other than malignant neoplasm: Secondary | ICD-10-CM | POA: Insufficient documentation

## 2012-10-15 HISTORY — PX: COLONOSCOPY: SHX5424

## 2012-10-15 SURGERY — COLONOSCOPY
Anesthesia: Moderate Sedation

## 2012-10-15 MED ORDER — SODIUM CHLORIDE 0.9 % IV SOLN
20.0000 ug | INTRAVENOUS | Status: DC | PRN
  Filled 2012-10-15: qty 5

## 2012-10-15 MED ORDER — FENTANYL CITRATE 0.05 MG/ML IJ SOLN
INTRAMUSCULAR | Status: AC
  Filled 2012-10-15: qty 4

## 2012-10-15 MED ORDER — SODIUM CHLORIDE 0.9 % IV SOLN
INTRAVENOUS | Status: DC
  Administered 2012-10-15: 500 mL via INTRAVENOUS

## 2012-10-15 MED ORDER — FENTANYL CITRATE 0.05 MG/ML IJ SOLN
INTRAMUSCULAR | Status: DC | PRN
  Administered 2012-10-15: 50 ug via INTRAVENOUS
  Administered 2012-10-15: 25 ug via INTRAVENOUS

## 2012-10-15 MED ORDER — MIDAZOLAM HCL 10 MG/2ML IJ SOLN
INTRAMUSCULAR | Status: AC
  Filled 2012-10-15: qty 4

## 2012-10-15 MED ORDER — MIDAZOLAM HCL 10 MG/2ML IJ SOLN
INTRAMUSCULAR | Status: DC | PRN
  Administered 2012-10-15 (×3): 2 mg via INTRAVENOUS

## 2012-10-15 NOTE — Progress Notes (Signed)
PHARMACY BRIEF NOTE - IV DDAVP for von Willebrand disease  The Endoscopy Dept. has requested a dose of IV DDAVP for this patient, as ordered by Dr. Loreta Ave.  The medication is to be available on a STAT basis if needed for acute bleeding during today's procedure, due to a history of von Willebrand disease.  The recommended IV dose for prophylaxis or acute bleeding episodes is 0.3 mcg/kg or a maximum of 20 mcg, diluted in 50 ml of normal saline and infused over 30 minutes.  Plan: Set aside components for STAT preparation of 20 mcg dose of IV DDAVP if requested.  Polo Riley R.Ph. 10/15/2012 12:45 PM

## 2012-10-15 NOTE — Op Note (Signed)
[facility] [address] [place], [address]   OPERATIVE PROCEDURE REPORT  PATIENT: Haley Buchanan, Haley Buchanan  MR#: [id number] BIRTHDATE: Aug 06, 1955 GENDER: Female ENDOSCOPIST: Dr.  Lorenza Burton, MD ASSISTANT:   Kandice Robinsons, technician Jimmey Ralph, RN, CGRN PROCEDURE DATE: 10/15/2012 PRE-PROCEDURE PREPARATION: Moviprep was taken as instructed (32 ounces the night prior to the procedure and 32 ounces the morning of the procedure).  The patient was fasted for four hours prior to the procedure. DDAVP was kept on stand by for the procedure as the patient has Von Willebrand's Disease.  PRE-PROCEDURE PHYSICAL: Patient has stable vital signs.  Neck is supple.  There is no JVD, thyromegaly or LAD.  Chest clear to auscultation.  S1 and S2 regular.  Abdomen soft, non-distended, non-tender with NABS. PROCEDURE:     Diagnostic colonoscopy. ASA CLASS:     Class III INDICATIONS:     1.  Colorectal cancer screening 2) Personal history of colonic polyps. MEDICATIONS:     Fentanyl 75 mcg and Versed 6 mg IV  DESCRIPTION OF PROCEDURE: After the risks, benefits, and alternatives of the procedure were thoroughly explained [including a 10% missed rate of cancer and polyps], informed consent was obtained.  Digital rectal exam was performed. The Pentax Adult Colonscope [id number]  was introduced through the anus  and advanced to the terminal ileum which was intubated for a short distance , limited by No adverse events experienced.   The quality of the prep was adequate, using MoviPrep . Multiple washes were done. Small lesions could be missed. The instrument was then slowly withdrawn as the colon was fully examined.     COLON FINDINGS: The entire colonic mucosa appeared healthy with a normal vascular pattern.  No masses, polyps, diverticula or AVMs were noted.  The appendiceal orifice and the ICV were identified and photographed.  The terminal ileum appeared normal.   Retroflexed views revealed small internal hemorrhoids. The patient tolerated the procedure without immediate complications.  The scope was then withdrawn from the patient and the procedure terminated.  TIME TO CECUM:  7 minutes 0 seconds WITHDRAW TIME:  6 minutes 0 seconds  IMPRESSION:     1.  Normal appearing colonic mucosa. 2.  Normal terminal ileum. 3.  Small internal hemorrhoids.    RECOMMENDATIONS:     1.  Continue surveillance. 2.  High fiber diet with liberal fluid intake. 3.  OP follow-up is advised on a PRN basis.   REPEAT EXAM:      in 7 years.  If the patient has any abnormal GI symptoms in the interim, she/he have been advised to contact the office as soon as possible for further recommendations.   CPT CODES:     [id number], Colonoscopy   DIAGNOSIS CODES:     V76.51, V12.72   REFERRED Rod Holler, M.D.  eSigned:  Dr. Lorenza Burton, MD 10/15/2012 2:08 PM   PATIENT NAME:  [name], [name] MR#: [id number]

## 2012-10-15 NOTE — H&P (Signed)
Haley Buchanan is an 57 y.o. female.   Chief Complaint: CRC screening. HPI: Patient is here for a screening colonoscopy. She has had a polyp removed on  A previous colonoscopy. She denies any active GI issues at this time. She is being done at the hospital as she has Von Willebrand disease.   Past Medical History  Diagnosis Date  . Allergy   . Anemia 09/2004    iron deficient  . Depression     Last psychiatrist - Dr. Melrose Nakayama - does not have records as seen prior to 2007  . Osteoporosis 02/2006    DEXA 04/23/12 w/ T score L femur -2.8 and L spine -1.4  . Von Willebrand's disease 03/2005  . Migraine   . Abnormal glandular Papanicolaou smear of cervix 06/2005    followed by LEEP, + HR HPV DNA 06/2008  . Thyroid disease 01/2007    subclinical hyperthyroidism  . Foot fracture, left 11/2006    5th MTP   Past Surgical History  Procedure Laterality Date  . Tmj arthroscopy    . Tonsillectomy and adenoidectomy    . Leep  2007   Family History  Problem Relation Age of Onset  . Osteoporosis Mother   . Heart Problems Mother   . Cancer Father   . Ulcers Father   . Osteoporosis Maternal Grandmother   . Alzheimer's disease Maternal Grandfather   . Depression Daughter   . Alcohol abuse Father   . Thyroid disease Mother   . Psychiatric Illness Brother    Social History:  reports that she quit smoking about 13 years ago. Her smoking use included Cigarettes. She has a 2 pack-year smoking history. She has never used smokeless tobacco. She reports that she drinks about 1.2 ounces of alcohol per week. She reports that she does not use illicit drugs.  Allergies: No Known Allergies  Medications Prior to Admission  Medication Sig Dispense Refill  . nitrofurantoin, macrocrystal-monohydrate, (MACROBID) 100 MG capsule Take 1 capsule (100 mg total) by mouth 2 (two) times daily.  14 capsule  0  . conjugated estrogens (PREMARIN) vaginal cream Apply pea-sized amount topically to urethra and inner labia  daily as directed.  42.5 g  2  . hydrocortisone (ANUSOL-HC) 25 MG suppository Place 1 suppository (25 mg total) rectally 2 (two) times daily.  12 suppository  0    No results found for this or any previous visit (from the past 48 hour(s)). No results found.  Review of Systems  Eyes: Negative.   Gastrointestinal: Negative.   Genitourinary: Positive for urgency.  Musculoskeletal: Positive for joint pain.  Neurological: Positive for headaches.  Endo/Heme/Allergies: Negative.   Psychiatric/Behavioral: Negative.     Blood pressure 126/75, pulse 65, temperature 98.3 F (36.8 C), temperature source Oral, resp. rate 9, height 5' 4.5" (1.638 m), weight 67.132 kg (148 lb). Physical Exam  Constitutional: She is oriented to person, place, and time. She appears well-developed and well-nourished.  HENT:  Head: Normocephalic and atraumatic.  Eyes: Conjunctivae and EOM are normal. Pupils are equal, round, and reactive to light.  Neck: Normal range of motion. Neck supple.  Cardiovascular: Normal rate and regular rhythm.   Respiratory: Effort normal and breath sounds normal.  GI: Soft. Bowel sounds are normal.  Musculoskeletal: Normal range of motion.  Neurological: She is alert and oriented to person, place, and time.  Skin: Skin is warm and dry.  Psychiatric: She has a normal mood and affect. Her behavior is normal. Judgment and thought content normal.  Assessment/Plan CRC screening: Proceed with a colonoscopy at this time.  Florinda Taflinger 10/15/2012, 1:29 PM

## 2012-10-16 ENCOUNTER — Encounter (HOSPITAL_COMMUNITY): Payer: Self-pay | Admitting: Gastroenterology

## 2013-04-25 ENCOUNTER — Ambulatory Visit (HOSPITAL_COMMUNITY)
Admission: RE | Admit: 2013-04-25 | Discharge: 2013-04-25 | Disposition: A | Discharge status: Home / Self Care | Payer: 59 | Source: Ambulatory Visit | Attending: Family Medicine | Admitting: Family Medicine

## 2013-04-25 ENCOUNTER — Ambulatory Visit: Payer: 59 | Admitting: Family Medicine

## 2013-04-25 VITALS — BP 110/70 | HR 68 | Temp 98.3°F | Resp 16 | Ht 65.5 in | Wt 140.0 lb

## 2013-04-25 DIAGNOSIS — R197 Diarrhea, unspecified: Secondary | ICD-10-CM

## 2013-04-25 DIAGNOSIS — R109 Unspecified abdominal pain: Secondary | ICD-10-CM

## 2013-04-25 DIAGNOSIS — M810 Age-related osteoporosis without current pathological fracture: Secondary | ICD-10-CM

## 2013-04-25 DIAGNOSIS — I700 Atherosclerosis of aorta: Secondary | ICD-10-CM | POA: Insufficient documentation

## 2013-04-25 DIAGNOSIS — R112 Nausea with vomiting, unspecified: Secondary | ICD-10-CM

## 2013-04-25 DIAGNOSIS — N390 Urinary tract infection, site not specified: Secondary | ICD-10-CM

## 2013-04-25 LAB — COMPREHENSIVE METABOLIC PANEL
ALBUMIN: 4.3 g/dL (ref 3.5–5.2)
ALK PHOS: 54 U/L (ref 39–117)
ALT: 36 U/L — AB (ref 0–35)
AST: 35 U/L (ref 0–37)
BUN: 11 mg/dL (ref 6–23)
CO2: 27 mEq/L (ref 19–32)
Calcium: 9.1 mg/dL (ref 8.4–10.5)
Chloride: 104 mEq/L (ref 96–112)
Creat: 0.8 mg/dL (ref 0.50–1.10)
Glucose, Bld: 101 mg/dL — ABNORMAL HIGH (ref 70–99)
POTASSIUM: 3.9 meq/L (ref 3.5–5.3)
SODIUM: 141 meq/L (ref 135–145)
TOTAL PROTEIN: 6.9 g/dL (ref 6.0–8.3)
Total Bilirubin: 0.6 mg/dL (ref 0.2–1.2)

## 2013-04-25 LAB — POCT URINALYSIS DIPSTICK
BILIRUBIN UA: NEGATIVE
Glucose, UA: NEGATIVE
NITRITE UA: POSITIVE
PH UA: 6.5
RBC UA: NEGATIVE
Spec Grav, UA: 1.02
Urobilinogen, UA: 0.2

## 2013-04-25 LAB — POCT CBC
GRANULOCYTE PERCENT: 46.9 % (ref 37–80)
HCT, POC: 41.3 % (ref 37.7–47.9)
Hemoglobin: 12.7 g/dL (ref 12.2–16.2)
Lymph, poc: 1.3 (ref 0.6–3.4)
MCH, POC: 27.4 pg (ref 27–31.2)
MCHC: 30.8 g/dL — AB (ref 31.8–35.4)
MCV: 89 fL (ref 80–97)
MID (CBC): 0.3 (ref 0–0.9)
MPV: 8.4 fL (ref 0–99.8)
PLATELET COUNT, POC: 251 10*3/uL (ref 142–424)
POC GRANULOCYTE: 1.4 — AB (ref 2–6.9)
POC LYMPH %: 42.4 % (ref 10–50)
POC MID %: 10.7 %M (ref 0–12)
RBC: 4.64 M/uL (ref 4.04–5.48)
RDW, POC: 15.7 %
WBC: 3 10*3/uL — AB (ref 4.6–10.2)

## 2013-04-25 LAB — POCT UA - MICROSCOPIC ONLY
CASTS, UR, LPF, POC: NEGATIVE
Crystals, Ur, HPF, POC: NEGATIVE
MUCUS UA: POSITIVE
RBC, urine, microscopic: NEGATIVE
YEAST UA: NEGATIVE

## 2013-04-25 LAB — POCT URINE PREGNANCY: Preg Test, Ur: NEGATIVE

## 2013-04-25 LAB — AMYLASE: Amylase: 42 U/L (ref 0–105)

## 2013-04-25 LAB — LIPASE: Lipase: 30 U/L (ref 0–75)

## 2013-04-25 MED ORDER — ONDANSETRON HCL 8 MG PO TABS: 8.0000 mg | ORAL_TABLET | Freq: Three times a day (TID) | ORAL | Status: DC | PRN

## 2013-04-25 MED ORDER — NITROFURANTOIN MONOHYD MACRO 100 MG PO CAPS: 100.0000 mg | ORAL_CAPSULE | Freq: Two times a day (BID) | ORAL | Status: DC

## 2013-04-25 NOTE — Patient Instructions (Signed)
We will get your ultrasound scheduled for today and will give you a call.  For now use the zofran as needed for nausea, and the Macrobid for UTI. I will be in touch with the rest of your labs as well

## 2013-04-25 NOTE — Progress Notes (Signed)
[facility] [address], [place] [address] [phone]  Date:  04/25/2013   Name:  Haley Buchanan   DOB:  1955-08-03   MRN:  [id number]  PCP:  Norberto SorensonSHAW,EVA, MD    Chief Complaint: Abdominal Pain, Emesis and Diarrhea   History of Present Illness:  Haley Buchanan is a 58 y.o. very pleasant female patient who presents with the following:  Here today with illness.  Today is Thursday.  On Tuesday evening she started feeling bad while driving home from work- she then developed vomiting and diarrhea.  She felt very weak and could barely get into bed.  She stopped vomiting Tuesday night.  Yesterday she felt very tired and slept most of the day.  Was able to drink some liquids and eat a piece of toast.  She has noted a persistent abdominal pain more in the LLQ  She is still not feeling well but was able to drive herself here today.  She did not notice any fever.  She checked her temp yesterday- ok.   Yesterday am was her last BM- it was diarrhea, none since then.   Last vomited Tuesday night.    No blood in her stool or vomit.  No history of abdominal surgery No urinary sx  No history of diverticulitis.    She is worried because it seems a doctor once told her that her appendix was on the left; she is not sure if she had a CT or another test that showed this finding.    Patient Active Problem List   Diagnosis Date Noted  . Urinary frequency 09/24/2012  . Osteoporosis 02/04/2012  . Headache, chronic daily 02/04/2012    Past Medical History  Diagnosis Date  . Allergy   . Anemia 09/2004    iron deficient  . Depression     Last psychiatrist - Dr. Melrose NakayamaPascal - does not have records as seen prior to 2007  . Osteoporosis 02/2006    DEXA 04/23/12 w/ T score L femur -2.8 and L spine -1.4  . Von Willebrand's disease 03/2005  . Migraine   . Abnormal glandular Papanicolaou smear of cervix 06/2005    followed by LEEP, + HR HPV DNA 06/2008  . Thyroid disease 01/2007   subclinical hyperthyroidism  . Foot fracture, left 11/2006    5th MTP    Past Surgical History  Procedure Laterality Date  . Tmj arthroscopy    . Tonsillectomy and adenoidectomy    . Leep  2007  . Colonoscopy N/A 10/15/2012    Procedure: COLONOSCOPY;  Surgeon: Charna ElizabethJyothi Mann, MD;  Location: [facility];  Service: Endoscopy;  Laterality: N/A;    History  Substance Use Topics  . Smoking status: Former Smoker -- 1.00 packs/day for 2 years    Types: Cigarettes    Quit date: 03/29/1999  . Smokeless tobacco: Never Used  . Alcohol Use: 1.2 oz/week    2 Glasses of wine per week    Family History  Problem Relation Age of Onset  . Osteoporosis Mother   . Heart Problems Mother   . Cancer Father   . Ulcers Father   . Osteoporosis Maternal Grandmother   . Alzheimer's disease Maternal Grandfather   . Depression Daughter   . Alcohol abuse Father   . Thyroid disease Mother   . Psychiatric Illness Brother     No Known Allergies  Medication list has been reviewed and updated.  Current Outpatient Prescriptions on File Prior to Visit  Medication Sig Dispense Refill  . conjugated estrogens (PREMARIN) vaginal cream Apply pea-sized amount topically to urethra and inner labia daily as directed.  42.5 g  2  . hydrocortisone (ANUSOL-HC) 25 MG suppository Place 1 suppository (25 mg total) rectally 2 (two) times daily.  12 suppository  0   No current facility-administered medications on file prior to visit.    Review of Systems:  As per HPI- otherwise negative.   Physical Examination: Filed Vitals:   04/25/13 0835  BP: 110/70  Pulse: 68  Temp: 98.3 F (36.8 C)  Resp: 16   Filed Vitals:   04/25/13 0835  Height: 5' 5.5" (1.664 m)  Weight: 140 lb (63.504 kg)   Body mass index is 22.93 kg/(m^2). Ideal Body Weight: Weight in (lb) to have BMI = 25: 152.2  GEN: WDWN, NAD, Non-toxic, A & O x 3, slim build, looks well HEENT: Atraumatic, Normocephalic. Neck supple. No masses, No LAD.   Bilateral TM wnl, oropharynx normal.  PEERL,EOMI.   Ears and Nose: No external deformity. CV: RRR, No M/G/R. No JVD. No thrill. No extra heart sounds. PULM: CTA B, no wheezes, crackles, rhonchi. No retractions. No resp. distress. No accessory muscle use. ABD: S, ND, +BS. No rebound. No HSM.  She has a slightly tender area in her LLQ.  Her abdominal aorta pulse is quite prominent  EXTR: No c/c/e NEURO Normal gait.  PSYCH: Normally interactive. Conversant. Not depressed or anxious appearing.  Calm demeanor.    Results for orders placed in visit on 04/25/13  POCT CBC      Result Value Range   WBC 3.0 (*) 4.6 - 10.2 K/uL   Lymph, poc 1.3  0.6 - 3.4   POC LYMPH PERCENT 42.4  10 - 50 %L   MID (cbc) 0.3  0 - 0.9   POC MID % 10.7  0 - 12 %M   POC Granulocyte 1.4 (*) 2 - 6.9   Granulocyte percent 46.9  37 - 80 %G   RBC 4.64  4.04 - 5.48 M/uL   Hemoglobin 12.7  12.2 - 16.2 g/dL   HCT, POC 16.1  09.6 - 47.9 %   MCV 89.0  80 - 97 fL   MCH, POC 27.4  27 - 31.2 pg   MCHC 30.8 (*) 31.8 - 35.4 g/dL   RDW, POC 04.5     Platelet Count, POC 251  142 - 424 K/uL   MPV 8.4  0 - 99.8 fL  POCT UA - MICROSCOPIC ONLY      Result Value Range   WBC, Ur, HPF, POC tntc     RBC, urine, microscopic neg     Bacteria, U Microscopic 3+     Mucus, UA pos     Epithelial cells, urine per micros 0-1     Crystals, Ur, HPF, POC neg     Casts, Ur, LPF, POC neg     Yeast, UA neg    POCT URINALYSIS DIPSTICK      Result Value Range   Color, UA yellow     Clarity, UA clear     Glucose, UA neg     Bilirubin, UA neg     Ketones, UA trace     Spec Grav, UA 1.020     Blood, UA neg     pH, UA 6.5     Protein, UA trace     Urobilinogen, UA 0.2     Nitrite, UA pos     Leukocytes,  UA small (1+)    POCT URINE PREGNANCY      Result Value Range   Preg Test, Ur Negative     Discussed with Almas in detail.  Her labs, sx, and exam are more suggestive of a viral gastroenteritis to me than appendicitis or  diverticulitis.  She does not have tachycardia, fever, or leukocytosis all of which are often seen with acute appendicitis or diverticulitis.  I do appreciate a strong abdominal aorta pulse- she has noted this too while feeling her belly and was worried about this.  Discussed doing a CT scan vs an ultrasound of her abdominal aorta.  Discussed the various merits and drawbacks of each test.    She opted to have the ultrasound today, which is a reasonable choice.   Assessment and Plan: Abdominal pain - Plan: POCT CBC, Comprehensive metabolic panel, Amylase, Lipase, POCT UA - Microscopic Only, POCT urinalysis dipstick, POCT urine pregnancy, US Aorta  Diarrhea  Nausea and vomiting - Plan: ondansetron (ZOFRAN) 8 MG tablet  UTI (urinary tract infection) - Plan: Urine culture, nitrofurantoin, macrocrystal-monohydrate, (MACROBID) 100 MG capsule  Suspect abdominal pain and now resolved vomiting and diarrhea due to viral gastroenteritis.  She also seems to have a UTI at this time.  Will treat with zofran prn and macrobid for her UTI  Send for an ultrasound to day to evaluate her AA.    Signed Abbe Amsterdam, MD  Received the results of her aortic ultrasound:   ULTRASOUND OF ABDOMINAL AORTA  TECHNIQUE: Ultrasound examination of the abdominal aorta was performed to evaluate for abdominal aortic aneurysm.  COMPARISON: None.  FINDINGS: Abdominal Aorta  No aneurysm identified. Very mild atherosclerosis identified in the abdominal aorta.  Maximum AP  Diameter: 1.9 cm  Maximum TRV  Diameter: 2.1 cm  IMPRESSION: No evidence of abdominal aortic aneurysm.   Results for orders placed in visit on 04/25/13  COMPREHENSIVE METABOLIC PANEL      Result Value Range   Sodium 141  135 - 145 mEq/L   Potassium 3.9  3.5 - 5.3 mEq/L   Chloride 104  96 - 112 mEq/L   CO2 27  19 - 32 mEq/L   Glucose, Bld 101 (*) 70 - 99 mg/dL   BUN 11  6 - 23 mg/dL   Creat 8.41  6.60 - 6.30 mg/dL   Total Bilirubin  0.6  0.2 - 1.2 mg/dL   Alkaline Phosphatase 54  39 - 117 U/L   AST 35  0 - 37 U/L   ALT 36 (*) 0 - 35 U/L   Total Protein 6.9  6.0 - 8.3 g/dL   Albumin 4.3  3.5 - 5.2 g/dL   Calcium 9.1  8.4 - 16.0 mg/dL  AMYLASE      Result Value Range   Amylase 42  0 - 105 U/L  LIPASE      Result Value Range   Lipase 30  0 - 75 U/L  POCT CBC      Result Value Range   WBC 3.0 (*) 4.6 - 10.2 K/uL   Lymph, poc 1.3  0.6 - 3.4   POC LYMPH PERCENT 42.4  10 - 50 %L   MID (cbc) 0.3  0 - 0.9   POC MID % 10.7  0 - 12 %M   POC Granulocyte 1.4 (*) 2 - 6.9   Granulocyte percent 46.9  37 - 80 %G   RBC 4.64  4.04 - 5.48 M/uL   Hemoglobin 12.7  12.2 -  16.2 g/dL   HCT, POC 16.1  09.6 - 47.9 %   MCV 89.0  80 - 97 fL   MCH, POC 27.4  27 - 31.2 pg   MCHC 30.8 (*) 31.8 - 35.4 g/dL   RDW, POC 04.5     Platelet Count, POC 251  142 - 424 K/uL   MPV 8.4  0 - 99.8 fL  POCT UA - MICROSCOPIC ONLY      Result Value Range   WBC, Ur, HPF, POC tntc     RBC, urine, microscopic neg     Bacteria, U Microscopic 3+     Mucus, UA pos     Epithelial cells, urine per micros 0-1     Crystals, Ur, HPF, POC neg     Casts, Ur, LPF, POC neg     Yeast, UA neg    POCT URINALYSIS DIPSTICK      Result Value Range   Color, UA yellow     Clarity, UA clear     Glucose, UA neg     Bilirubin, UA neg     Ketones, UA trace     Spec Grav, UA 1.020     Blood, UA neg     pH, UA 6.5     Protein, UA trace     Urobilinogen, UA 0.2     Nitrite, UA pos     Leukocytes, UA small (1+)    POCT URINE PREGNANCY      Result Value Range   Preg Test, Ur Negative     Spoke to her in the evening- she reports feeling "good, back to normal."  Await urine culture She also was supposed to be referred to rheum a few months ago due to osteoporosis but never got this appt- will repeat referral for her.

## 2013-04-27 LAB — URINE CULTURE

## 2013-04-30 ENCOUNTER — Ambulatory Visit: Payer: 59 | Admitting: Family Medicine

## 2013-04-30 VITALS — BP 104/68 | HR 74 | Temp 98.8°F | Resp 18 | Ht 64.5 in | Wt 144.0 lb

## 2013-04-30 DIAGNOSIS — R109 Unspecified abdominal pain: Secondary | ICD-10-CM

## 2013-04-30 DIAGNOSIS — R110 Nausea: Secondary | ICD-10-CM

## 2013-04-30 LAB — POCT UA - MICROSCOPIC ONLY
Casts, Ur, LPF, POC: NEGATIVE
Crystals, Ur, HPF, POC: NEGATIVE
Mucus, UA: POSITIVE
Yeast, UA: NEGATIVE

## 2013-04-30 LAB — POCT URINALYSIS DIPSTICK
Blood, UA: NEGATIVE
Glucose, UA: NEGATIVE
Ketones, UA: 15
Leukocytes, UA: NEGATIVE
Nitrite, UA: NEGATIVE
Spec Grav, UA: 1.02
Urobilinogen, UA: 0.2
pH, UA: 5.5

## 2013-04-30 NOTE — Patient Instructions (Signed)
Please stop the Macrobid anatomy know if symptoms have not started to resolve in 24 hours. Start probiotics as well and continue them for one week. Avoid dairy products such as milk, ice cream and cheese for 24 hours

## 2013-04-30 NOTE — Progress Notes (Signed)
Subjective:  This chart was scribed for Haley SidleKurt Lauenstein, MD by Carl Bestelina Holson, Medical Scribe. This patient was seen in Room 3 and the patient's care was started at 4:37 PM.   Patient ID: Haley Buchanan, female    DOB: 10-02-1955, 58 y.o.   MRN: [id number]  HPI HPI Comments: Haley Buchanan is a 58 y.o. female who [facility] follow-up appointment.  The patient states that last Wednesday she was complaining of vomiting and diarrhea and [facility].  The patient states that she was prescribed Nitrofurantoin and noticed an improvement in her symptoms.  She states that she vomited on Friday night and has been complaining of constant nausea since then.  She states that she has not vomited since Friday night.  She denies taking the Nitrofurantoin before.  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     Past Medical History  Diagnosis Date   Allergy    Anemia 09/2004    iron deficient   Depression     Last psychiatrist - Dr. Melrose NakayamaPascal - does not have records as seen prior to 2007   Osteoporosis 02/2006    DEXA 04/23/12 w/ T score L femur -2.8 and L spine -1.4   Von Willebrand's disease 03/2005   Migraine    Abnormal glandular Papanicolaou smear of cervix 06/2005    followed by LEEP, + HR HPV DNA 06/2008   Thyroid disease 01/2007    subclinical hyperthyroidism   Foot fracture, left 11/2006    5th MTP   Past Surgical History  Procedure Laterality Date   Tmj arthroscopy     Tonsillectomy and adenoidectomy     Leep  2007   Colonoscopy N/A 10/15/2012    Procedure: COLONOSCOPY;  Surgeon: Charna ElizabethJyothi Mann, MD;  Location: [facility];  Service: Endoscopy;  Laterality: N/A;   Family History  Problem Relation Age of Onset   Osteoporosis Mother    Heart Problems Mother    Cancer Father    Ulcers Father    Osteoporosis Maternal Grandmother    Alzheimer's disease Maternal Grandfather    Depression  Daughter    Alcohol abuse Father    Thyroid disease Mother    Psychiatric Illness Brother    History   Social History   Marital Status: Divorced    Spouse Name: N/A    Number of Children: N/A   Years of Education: N/A   Occupational History   Not on file.   Social History Main Topics   Smoking status: Former Smoker -- 1.00 packs/day for 2 years    Types: Cigarettes    Quit date: 03/29/1999   Smokeless tobacco: Never Used   Alcohol Use: 1.2 oz/week    2 Glasses of wine per week   Drug Use: No   Sexual Activity: No   Other Topics Concern   Not on file   Social History Narrative   No narrative on file   No Known Allergies  Review of Systems  Gastrointestinal: Positive for nausea and diarrhea. Negative for vomiting.  All other systems reviewed and are negative.     Objective:  Physical Exam  Nursing note and vitals reviewed. Constitutional: She is oriented to person, place, and time. She appears well-developed and well-nourished. No distress.  HENT:  Head: Normocephalic and atraumatic.  Eyes: EOM are normal.  Cardiovascular: Normal rate.   Musculoskeletal: Normal range of motion.  Neurological: She is  alert and oriented to person, place, and time.  Skin: Skin is warm and dry.  Psychiatric: She has a normal mood and affect. Her behavior is normal.    Chest is clear, heart is regular with no murmur Abdomen: Increased bowel sounds, soft, no mass, no HSM, no guarding or rebound Results for orders placed in visit on 04/30/13  POCT URINALYSIS DIPSTICK      Result Value Range   Color, UA amber     Clarity, UA clear     Glucose, UA negative     Bilirubin, UA small     Ketones, UA 15     Spec Grav, UA 1.020     Blood, UA negative     pH, UA 5.5     Protein, UA trace     Urobilinogen, UA 0.2     Nitrite, UA negative     Leukocytes, UA Negative    '  BP 104/68   Pulse 74   Temp(Src) 98.8 F (37.1 C) (Oral)   Resp 18   Ht 5' 4.5" (1.638 m)   Wt  144 lb (65.318 kg)   BMI 24.34 kg/m2   SpO2 99% Assessment & Plan:    I personally performed the services described in this documentation, which was scribed in my presence. The recorded information has been reviewed and is accurate.  Since most consistent with adverse reaction to Macrobid. I asked patient to stop the Macrobid and let me know in 24 hours if things have not improved.

## 2013-06-05 ENCOUNTER — Telehealth: Payer: Self-pay

## 2013-06-05 NOTE — Telephone Encounter (Signed)
Spoke to pt will send fax as requested, and leave a copy for her at the front desk.

## 2013-06-05 NOTE — Telephone Encounter (Signed)
Patient called once again requesting Bone scan results to be faxed over to a Dr. Dareen PianoAnderson at [phone] to [facility]. Patient will also like a copy of bone scan results to be made for her personally. Please call patient once bone scan results are prepared and ready to pick up.

## 2013-07-26 ENCOUNTER — Ambulatory Visit (INDEPENDENT_AMBULATORY_CARE_PROVIDER_SITE_OTHER): Payer: 59 | Admitting: Physician Assistant

## 2013-07-26 VITALS — BP 120/80 | HR 83 | Temp 98.6°F | Resp 16 | Ht 63.25 in | Wt 144.0 lb

## 2013-07-26 DIAGNOSIS — N390 Urinary tract infection, site not specified: Secondary | ICD-10-CM

## 2013-07-26 DIAGNOSIS — R300 Dysuria: Secondary | ICD-10-CM

## 2013-07-26 DIAGNOSIS — R350 Frequency of micturition: Secondary | ICD-10-CM

## 2013-07-26 LAB — POCT URINALYSIS DIPSTICK
BILIRUBIN UA: NEGATIVE
GLUCOSE UA: NEGATIVE
Nitrite, UA: POSITIVE
Protein, UA: 100
Spec Grav, UA: 1.015
Urobilinogen, UA: 0.2
pH, UA: 8.5

## 2013-07-26 LAB — POCT UA - MICROSCOPIC ONLY
Casts, Ur, LPF, POC: NEGATIVE
Crystals, Ur, HPF, POC: NEGATIVE
MUCUS UA: NEGATIVE
Yeast, UA: NEGATIVE

## 2013-07-26 MED ORDER — CIPROFLOXACIN HCL 250 MG PO TABS: 250.0000 mg | ORAL_TABLET | Freq: Two times a day (BID) | ORAL | Status: DC

## 2013-07-26 NOTE — Progress Notes (Signed)
Subjective:    Patient ID: Haley Buchanan, female    DOB: 19-Mar-1956, 58 y.o.   MRN: [id number]  HPI Primary Physician: Norberto SorensonSHAW,EVA, MD  Chief Complaint: Dysuria x 5 days  HPI: 58 y.o. female with history below presents with 5 day history of urinary frequency, urinary urgency, and dysuria. Afebrile. Some chills the previous night, however she is known to get chills at baseline. No vaginal symptoms including itching, bleeding, burning, or discharge. When she was cleaning with the wipe for her UA today she did note a small amount of "pink" on the wipe. Has a history of hemorrhoids and sometimes has a difficult time cleaning.  Has been taking Azo. Last UTI was back in February 2015. Did not tolerate Macrobid well. GI upset, however she was being seen for GI related issues at that time to begin with.    Past Medical History  Diagnosis Date  . Allergy   . Anemia 09/2004    iron deficient  . Depression     Last psychiatrist - Dr. Melrose NakayamaPascal - does not have records as seen prior to 2007  . Osteoporosis 02/2006    DEXA 04/23/12 w/ T score L femur -2.8 and L spine -1.4  . Von Willebrand's disease 03/2005  . Migraine   . Abnormal glandular Papanicolaou smear of cervix 06/2005    followed by LEEP, + HR HPV DNA 06/2008  . Thyroid disease 01/2007    subclinical hyperthyroidism  . Foot fracture, left 11/2006    5th MTP     Home Meds: Prior to Admission medications   Medication Sig Start Date End Date Taking? Authorizing Provider                  Allergies: No Known Allergies  History   Social History  . Marital Status: Divorced    Spouse Name: N/A    Number of Children: N/A  . Years of Education: N/A   Occupational History  . Not on file.   Social History Main Topics  . Smoking status: Former Smoker -- 1.00 packs/day for 2 years    Types: Cigarettes    Quit date: 03/29/1999  . Smokeless tobacco: Never Used  . Alcohol Use: 1.2 oz/week    2 Glasses of wine per week  . Drug Use: No    . Sexual Activity: No   Other Topics Concern  . Not on file   Social History Narrative  . No narrative on file     Review of Systems  Constitutional: Positive for chills. Negative for fever, appetite change and fatigue.       Chills the previous evening. She gets cold easily.   Gastrointestinal: Negative for nausea, vomiting and abdominal pain.  Genitourinary: Positive for dysuria, urgency, frequency, hematuria and decreased urine volume. Negative for flank pain, vaginal bleeding, vaginal discharge, enuresis, difficulty urinating, genital sores, vaginal pain, menstrual problem and pelvic pain.       On and off dysuria.   Musculoskeletal: Negative for back pain.  Psychiatric/Behavioral: Negative for hallucinations, behavioral problems, confusion and agitation.       Objective:   Physical Exam  Physical Exam: Blood pressure 120/80, pulse 83, temperature 98.6 F (37 C), temperature source Oral, resp. rate 16, height 5' 3.25" (1.607 m), weight 144 lb (65.318 kg), SpO2 99.00%., Body mass index is 25.29 kg/(m^2). General: Well developed, well nourished, in no acute distress. Head: Normocephalic, atraumatic, eyes without discharge, sclera non-icteric, nares are without discharge. Bilateral auditory canals clear, TM's  are without perforation, pearly grey and translucent with reflective cone of light bilaterally. Oral cavity moist, posterior pharynx without exudate, erythema, peritonsillar abscess, or post nasal drip. Uvula midline.   Neck: Supple. No thyromegaly. Full ROM. No lymphadenopathy. No nuchal rigidity.  Lungs: Clear bilaterally to auscultation without wheezes, rales, or rhonchi. Breathing is unlabored. Heart: RRR with S1 S2. No murmurs, rubs, or gallops appreciated. Abdomen: Soft, non-distended with normoactive bowel sounds. Mild suprapubic TTP, otherwise no TTP throughout abdomen. No hepatosplenomegaly. No rebound/guarding. No obvious abdominal masses. Negative McBurney's,  Rovsing's and Iliopsoas.  Msk:  Strength and tone normal for age. Extremities/Skin: Warm and dry. No clubbing or cyanosis. No edema. No rashes or suspicious lesions. Neuro: Alert and oriented X 3. Moves all extremities spontaneously. Gait is normal. CNII-XII grossly in tact. Psych:  Responds to questions appropriately with a normal affect.    Labs: Results for orders placed in visit on 07/26/13  POCT URINALYSIS DIPSTICK      Result Value Ref Range   Color, UA yellow     Clarity, UA clear     Glucose, UA neg     Bilirubin, UA neg     Ketones, UA trace     Spec Grav, UA 1.015     Blood, UA trace     pH, UA 8.5     Protein, UA 100     Urobilinogen, UA 0.2     Nitrite, UA pos     Leukocytes, UA moderate (2+)    POCT UA - MICROSCOPIC ONLY      Result Value Ref Range   WBC, Ur, HPF, POC tntc     RBC, urine, microscopic 1-3     Bacteria, U Microscopic 2+     Mucus, UA neg     Epithelial cells, urine per micros 0-1     Crystals, Ur, HPF, POC neg     Casts, Ur, LPF, POC neg     Yeast, UA neg     Urine culture pending    Assessment & Plan:  58 year old female with UTI  -Cipro 250 mg  -Azo -Push fluids -Await urine culture results -RTC at completion of Cipro for recheck of UA to recheck of hematuria and recheck of patient's status of small amount of "pink" on wipe when collecting UA -Discussed possible causes and risks -RTC precautions   Eula Listenyan Zarinah Oviatt, MHS, PA-C [facility] [address] [place], [place] [address] [phone] [facility] 07/26/2013 6:44 PM

## 2013-07-28 LAB — URINE CULTURE: Colony Count: >=100000

## 2013-08-07 ENCOUNTER — Telehealth: Payer: Self-pay

## 2013-08-07 NOTE — Telephone Encounter (Signed)
Pt returned missed call from Dr.Dunn wanting a follow up, said she is feeling wonderful, and on her last pill. Call if needed @ [phone] after [phone]

## 2013-08-08 NOTE — Telephone Encounter (Signed)
Noted  

## 2013-08-28 ENCOUNTER — Encounter: Payer: Self-pay | Admitting: Family Medicine

## 2013-11-25 ENCOUNTER — Ambulatory Visit (INDEPENDENT_AMBULATORY_CARE_PROVIDER_SITE_OTHER): Payer: 59

## 2013-11-25 ENCOUNTER — Ambulatory Visit (INDEPENDENT_AMBULATORY_CARE_PROVIDER_SITE_OTHER): Payer: 59 | Admitting: Internal Medicine

## 2013-11-25 VITALS — BP 112/76 | HR 77 | Temp 98.8°F | Resp 16 | Ht 64.75 in | Wt 150.8 lb

## 2013-11-25 DIAGNOSIS — M25579 Pain in unspecified ankle and joints of unspecified foot: Secondary | ICD-10-CM

## 2013-11-25 DIAGNOSIS — S82409A Unspecified fracture of shaft of unspecified fibula, initial encounter for closed fracture: Secondary | ICD-10-CM

## 2013-11-25 DIAGNOSIS — M79609 Pain in unspecified limb: Secondary | ICD-10-CM

## 2013-11-25 DIAGNOSIS — S82402A Unspecified fracture of shaft of left fibula, initial encounter for closed fracture: Secondary | ICD-10-CM

## 2013-11-25 DIAGNOSIS — M79671 Pain in right foot: Secondary | ICD-10-CM

## 2013-11-25 DIAGNOSIS — M79672 Pain in left foot: Secondary | ICD-10-CM

## 2013-11-25 DIAGNOSIS — M25572 Pain in left ankle and joints of left foot: Secondary | ICD-10-CM

## 2013-11-25 MED ORDER — MELOXICAM 15 MG PO TABS: 15.0000 mg | ORAL_TABLET | Freq: Every day | ORAL | Status: DC

## 2013-11-25 NOTE — Progress Notes (Signed)
   Subjective:    Patient ID: Haley Buchanan, female    DOB: [birth year], 58 y.o.   MRN: [id number]  HPI 58 year old female with  Cc pain in both feet and in the left ankle and calf HPI Fall down 3 steps outside her home 12 days ago . Initially pain was in the left calf behind the left knee in the left foot and in the right foot. Has been bearing weight sin ces the injury and the pain now is less 3/10 but she is still having pain as she ambulates through the day and the pain is assoc with swelling and ecchymosis and is mild in severity.Non radiating, constant. She asked the nurse at her job [employer] if she should see a dr and was instructed to since she is having continued pain swelling and bruising. No other injuries no longer has any pain in the calf or in the knee on the left side.  Review of Systems  Constitutional: Negative.   HENT: Negative.   Eyes: Negative.   Respiratory: Negative.   Cardiovascular: Negative.   Gastrointestinal: Negative.   Endocrine: Negative.   Genitourinary: Negative.   Musculoskeletal: Positive for gait problem and joint swelling.       Swelling bilateral ankles  Skin: Positive for color change.       ecchymosis both feet and ankles  Allergic/Immunologic: Negative.   Neurological: Negative.   Hematological: Negative.   Psychiatric/Behavioral: Negative.   All other systems reviewed and are negative.      Objective:   Physical Exam  Nursing note and vitals reviewed. Constitutional: She is oriented to person, place, and time. She appears well-developed and well-nourished.  HENT:  Head: Normocephalic and atraumatic.  Eyes: Conjunctivae and EOM are normal. Pupils are equal, round, and reactive to light.  Neck: Normal range of motion.  Cardiovascular: Normal rate and regular rhythm.   Pulmonary/Chest: Effort normal.  Abdominal: Soft.  Musculoskeletal: She exhibits edema and tenderness.  Right foot tender with swelling and ecchymosis of the 5th  metatarsal Left foot and ankle diffuse ecchymosis of the tarsal area and dorsum of the foot radiating laterally and medially with increased tenderness to the tarsal ara and the 5th metatarsal. tender to the lateral malleolus. No pain in the calf or the posterior thigh, negative homans sign, no knee effusion.   Neurological: She is alert and oriented to person, place, and time.  Skin: Skin is warm and dry.  ecchymosis  Psychiatric: She has a normal mood and affect. Her behavior is normal. Judgment and thought content normal.     [facility] reading (PRIMARY) by  Dr.Venna Berberich  left foot - nondisplaced incomplete fracture of the distal fibula left ankle - nondisplaced incomplete fracture of the distal fibula Right foot- negitive      Assessment & Plan:  Pt has extensive bruising and some tenderness bilateral feet and the left ankle. Will eval with xrays and treat accordingly She has a nondisplaced incomplete fracture with callous formation of the distal finbula. Will put in cam walker for 3 weeks and suggest elevation and ice. meloxicam fo rpain and swelling. followup in one week fo rre eval.

## 2013-12-05 ENCOUNTER — Ambulatory Visit (INDEPENDENT_AMBULATORY_CARE_PROVIDER_SITE_OTHER): Payer: 59 | Admitting: Family Medicine

## 2013-12-05 ENCOUNTER — Ambulatory Visit (INDEPENDENT_AMBULATORY_CARE_PROVIDER_SITE_OTHER): Payer: 59

## 2013-12-05 VITALS — BP 124/76 | HR 75 | Temp 98.1°F | Resp 20 | Ht 64.75 in | Wt 159.2 lb

## 2013-12-05 DIAGNOSIS — K649 Unspecified hemorrhoids: Secondary | ICD-10-CM

## 2013-12-05 DIAGNOSIS — M25579 Pain in unspecified ankle and joints of unspecified foot: Secondary | ICD-10-CM

## 2013-12-05 DIAGNOSIS — S93409A Sprain of unspecified ligament of unspecified ankle, initial encounter: Secondary | ICD-10-CM

## 2013-12-05 DIAGNOSIS — R300 Dysuria: Secondary | ICD-10-CM

## 2013-12-05 DIAGNOSIS — M25572 Pain in left ankle and joints of left foot: Secondary | ICD-10-CM

## 2013-12-05 MED ORDER — HYDROCORTISONE ACE-PRAMOXINE 2.5-1 % RE CREA: 1.0000 "application " | TOPICAL_CREAM | Freq: Three times a day (TID) | RECTAL | Status: DC

## 2013-12-05 MED ORDER — HYDROCORTISONE ACETATE 25 MG RE SUPP: 25.0000 mg | Freq: Two times a day (BID) | RECTAL | Status: DC

## 2013-12-05 MED ORDER — ESTROGENS, CONJUGATED 0.625 MG/GM VA CREA: TOPICAL_CREAM | VAGINAL | Status: DC

## 2013-12-05 NOTE — Patient Instructions (Addendum)
Wear the ankle brace  Avoid prolonged walking or standing  Return if not much better in 3 weeks  Use the hemorrhoid suppositories or the hemorrhoid creams on an as-needed basis.  Continue to just use the Premarin cream a small amount when needed  Return if persistent vaginal complaints or hemorrhoidal problems.

## 2013-12-05 NOTE — Progress Notes (Signed)
Subjective: 58 year old lady who is here for several things. She was seen last week for a possible ankle fracture. The radiologist exited not read a fracture. She continues to hurt some in the area just below the lateral malleolus. She's been wearing a Cam Walker. She also has been having intermittent problems with hemorrhoids would like a refill of her hemorrhoidal medication. She has a vaginal irritation sometimes associated with urination for which she has intermittently used Premarin cream if she would like a refill on that. No major complaints.  Objective: Ankle is tender just below the lateral malleolus. The malleolus itself is nontender. No pain with vibration. Toes are normal though she is tender in the dorsum of the midfoot. Pelvic and rectal not done tonight. Discussed with her.  [facility] reading (PRIMARY) by  Dr. Alwyn Ren No fracture or callous noted.    Assessment: Ankle pain/sprain Intermittent hemorrhoids Intermittent dysuria  Plan: I refilled her other medications. She asked for hemorrhoidal cream which I gave her. However she has further problems she needs to get checked.  Prescribed a Swede-O splint for her. If the ankle improves no further followup is needed. If she is concerned she is to come back

## 2014-01-22 ENCOUNTER — Telehealth: Payer: Self-pay | Admitting: Gynecology

## 2014-01-22 NOTE — Telephone Encounter (Signed)
Left message regarding her cancelled appointment.

## 2014-02-04 ENCOUNTER — Encounter: Payer: 59 | Admitting: Gynecology

## 2014-02-17 ENCOUNTER — Ambulatory Visit (INDEPENDENT_AMBULATORY_CARE_PROVIDER_SITE_OTHER): Payer: 59 | Admitting: Nurse Practitioner

## 2014-02-17 ENCOUNTER — Encounter: Payer: Self-pay | Admitting: Nurse Practitioner

## 2014-02-17 ENCOUNTER — Other Ambulatory Visit: Payer: Self-pay

## 2014-02-17 VITALS — BP 118/84 | HR 72 | Resp 16 | Ht 64.25 in | Wt 156.0 lb

## 2014-02-17 DIAGNOSIS — Z1231 Encounter for screening mammogram for malignant neoplasm of breast: Secondary | ICD-10-CM

## 2014-02-17 DIAGNOSIS — Z01419 Encounter for gynecological examination (general) (routine) without abnormal findings: Secondary | ICD-10-CM

## 2014-02-17 DIAGNOSIS — Z0000 Encounter for general adult medical examination without abnormal findings: Secondary | ICD-10-CM

## 2014-02-17 DIAGNOSIS — N3001 Acute cystitis with hematuria: Secondary | ICD-10-CM

## 2014-02-17 LAB — POCT URINALYSIS DIPSTICK
LEUKOCYTES UA: NEGATIVE
PH UA: 5
Urobilinogen, UA: NEGATIVE

## 2014-02-17 LAB — CBC WITH DIFFERENTIAL/PLATELET
BASOS ABS: 0.1 10*3/uL (ref 0.0–0.1)
BASOS PCT: 1 % (ref 0–1)
Eosinophils Absolute: 0.2 10*3/uL (ref 0.0–0.7)
Eosinophils Relative: 3 % (ref 0–5)
HCT: 36.1 % (ref 36.0–46.0)
Hemoglobin: 12 g/dL (ref 12.0–15.0)
LYMPHS PCT: 36 % (ref 12–46)
Lymphs Abs: 1.9 10*3/uL (ref 0.7–4.0)
MCH: 28.2 pg (ref 26.0–34.0)
MCHC: 33.2 g/dL (ref 30.0–36.0)
MCV: 84.9 fL (ref 78.0–100.0)
MPV: 9.3 fL — AB (ref 9.4–12.4)
Monocytes Absolute: 0.4 10*3/uL (ref 0.1–1.0)
Monocytes Relative: 8 % (ref 3–12)
NEUTROS PCT: 52 % (ref 43–77)
Neutro Abs: 2.8 10*3/uL (ref 1.7–7.7)
PLATELETS: 243 10*3/uL (ref 150–400)
RBC: 4.25 MIL/uL (ref 3.87–5.11)
RDW: 14.6 % (ref 11.5–15.5)
WBC: 5.3 10*3/uL (ref 4.0–10.5)

## 2014-02-17 LAB — HEMOGLOBIN, FINGERSTICK: Hemoglobin, fingerstick: 12 g/dL (ref 12.0–16.0)

## 2014-02-17 LAB — VITAMIN B12: VITAMIN B 12: 1002 pg/mL — AB (ref 211–911)

## 2014-02-17 MED ORDER — CEPHALEXIN 500 MG PO CAPS: ORAL_CAPSULE | ORAL | Status: DC

## 2014-02-17 NOTE — Patient Instructions (Signed)

## 2014-02-17 NOTE — Progress Notes (Signed)
58 y.o. 735P2 Divorced Caucasian Fe here for NGYN annual exam. She comes to us after being followed by PCP for GYN care.  She is now postsmenopausal for 8 years.  She has not been on HRT but does use vaginal estrogen at the urethra to reduce UTI symptoms.  Her main concerns today are varied:  She has "felt out of balance" for about 2 months.  Lost about 40 lbs and few years ago and kept it off for a year.  Then this past year 'increase in hunger' but has maintained a well balanced diet and still gained about 10 lbs.  Had problems with headaches that got better with menopause.  now headaches again.  A lot of work stress and even working longer hours.  Balance is off and has noted to be dropping things and even had a slip and fall. No difference in fine motor coordination. Decrease in memory for short term things, long tern seems to be OK .  Thought related to Topamax so that was stopped. Her prayer partner [place] and certain events that just happened she did not remember.   [place] years [place].  She desires to have test done to see if ok.  No FMH of Alzheinmers, demenetia, or MS.  She has been diagnosed with HSV genital and thought she had an outbreak a week ago - took Valtrex.  But now thinks another area is back but feels different with some tenderness.  She is also having some urinary frequency.   Patient's last menstrual period was 02/17/2006.          Sexually active: No.  The current method of family planning is post menopausal status.    Exercising: Yes.    Gym, walking , bike riding  Smoker:  no  Health Maintenance: Pap: 02/04/2012 Negative ( In epic) ; 08/28/12 per patient (histroy of abnormal pap with LEEP in 2007 - will try to get MR) MMG:  04/24/12 Bi-Rads 0- Colonoscopy:   10/15/12 repeat in 7 years  BMD:   04/24/12  TDaP:   Within 3 years ago  Labs: Hgb: 12.0 ; Urine: + Nitrites, Trace RBC'S   reports that she quit smoking about 14 years ago. Her  smoking use included Cigarettes. She has a 2 pack-year smoking history. She has never used smokeless tobacco. She reports that she drinks about 1.2 oz of alcohol per week. She reports that she does not use illicit drugs.  Past Medical History  Diagnosis Date  . Allergy   . Anemia 09/2004    iron deficient  . Depression     Last psychiatrist - Dr. Melrose NakayamaPascal - does not have records as seen prior to 2007  . Osteoporosis 02/2006    DEXA 04/23/12 w/ T score L femur -2.8 and L spine -1.4  . Von Willebrand's disease 03/2005  . Migraine   . Abnormal glandular Papanicolaou smear of cervix 06/2005    followed by LEEP, + HR HPV DNA 06/2008  . Thyroid disease 01/2007    subclinical hyperthyroidism  . Foot fracture, left 11/2006    5th MTP    Past Surgical History  Procedure Laterality Date  . Tmj arthroscopy    . Tonsillectomy and adenoidectomy    . Leep  2007  . Colonoscopy N/A 10/15/2012    Procedure: COLONOSCOPY;  Surgeon: Charna ElizabethJyothi Mann, MD;  Location: [facility];  Service: Endoscopy;  Laterality: N/A;    Current Outpatient Prescriptions  Medication Sig Dispense Refill  . conjugated estrogens (PREMARIN) vaginal cream Apply pea-sized amount topically to urethra and inner labia daily as directed. 42.5 g 2  . hydrocortisone (ANUSOL-HC) 25 MG suppository Place 1 suppository (25 mg total) rectally 2 (two) times daily. 12 suppository 1  . hydrocortisone-pramoxine (ANALPRAM-HC) 2.5-1 % rectal cream Place 1 application rectally 3 (three) times daily. 30 g 0  . meloxicam (MOBIC) 15 MG tablet Take 1 tablet (15 mg total) by mouth daily. 30 tablet 1  . cephALEXin (KEFLEX) 500 MG capsule Take BID for 7 days. 14 capsule 0   No current facility-administered medications for this visit.    Family History  Problem Relation Age of Onset  . Osteoporosis Mother   . Heart Problems Mother   . Cancer Father   . Ulcers Father   . Osteoporosis Maternal Grandmother   . Alzheimer's disease Maternal Grandfather    . Depression Daughter   . Alcohol abuse Father   . Thyroid disease Mother   . Psychiatric Illness Brother     ROS:  Pertinent items are noted in HPI.  Otherwise, a comprehensive ROS was negative.  Exam:   BP 118/84 mmHg  Pulse 72  Resp 16  Ht 5' 4.25" (1.632 m)  Wt 156 lb (70.761 kg)  BMI 26.57 kg/m2  LMP 02/17/2006 Height: 5' 4.25" (163.2 cm)  Ht Readings from Last 3 Encounters:  02/17/14 5' 4.25" (1.632 m)  12/05/13 5' 4.75" (1.645 m)  11/25/13 5' 4.75" (1.645 m)    General appearance: alert, cooperative and appears stated age Head: Normocephalic, without obvious abnormality, atraumatic Neck: no adenopathy, supple, symmetrical, trachea midline and thyroid normal to inspection and palpation Lungs: clear to auscultation bilaterally Breasts: normal appearance, no masses or tenderness Heart: regular rate and rhythm Abdomen: soft, non-tender; no masses,  no organomegaly Extremities: extremities normal, atraumatic, no cyanosis or edema Skin: Skin color, texture, turgor normal. No rashes or lesions Lymph nodes: Cervical, supraclavicular, and axillary nodes normal. No abnormal inguinal nodes palpated Neurologic: Grossly normal   Pelvic: External genitalia:  Below th right labia there is a sebaceous cystic lesion that is 1 cm in size and slight discomfort.  It is without discharge.  No swelling can be felt internally or on anal exam.  This is not a HSV outbreak.  No other lesions.  Pt. was seen by Dr. Hyacinth MeekerMiller as well.              Urethra:  normal appearing urethra with no masses, tenderness or lesions              Bartholin's and Skene's: normal                 Vagina: normal appearing vagina with normal color and discharge, no lesions              Cervix: anteverted              Pap taken: Yes.   Bimanual Exam:  Uterus:  normal size, contour, position, consistency, mobility, non-tender              Adnexa: no mass, fullness, tenderness               Rectovaginal: Confirms                Anus:  normal sphincter tone, no lesions  A:  Well Woman with normal exam  Postmenopausal - no HRT  Atrophic vaginitis and UTI - on vaginal estrogen  to the urethra  Generally does not feel well with multiple complaints - some deals with memory  Sebaceous cyst below the right labia  History of Von Willeband's disease, subclinical hyperthyroid  Osteoporosis followed by PCP  History of LEEP for abnormal pap 2007   P:   Reviewed health and wellness pertinent to exam  Pap smear taken today  Mammogram is due and will schedule  Started on Keflex for the sebaceous cyst and UTI - will recheck in 1 week  Will follow with labs and culture  Counseled on breast self exam, mammography screening, adequate intake of calcium and vitamin D, diet and exercise return annually or prn  An After Visit Summary was printed and given to the patient.

## 2014-02-18 ENCOUNTER — Telehealth: Payer: Self-pay | Admitting: Nurse Practitioner

## 2014-02-18 LAB — LIPID PANEL
CHOL/HDL RATIO: 2.8 ratio
CHOLESTEROL: 187 mg/dL (ref 0–200)
HDL: 67 mg/dL (ref >39)
LDL CALC: 109 mg/dL — AB (ref 0–99)
Triglycerides: 53 mg/dL (ref <150)
VLDL: 11 mg/dL (ref 0–40)

## 2014-02-18 LAB — URINALYSIS, MICROSCOPIC ONLY
CASTS: NONE SEEN
Crystals: NONE SEEN
SQUAMOUS EPITHELIAL / LPF: NONE SEEN

## 2014-02-18 LAB — VITAMIN D 25 HYDROXY (VIT D DEFICIENCY, FRACTURES): Vit D, 25-Hydroxy: 43 ng/mL (ref 30–100)

## 2014-02-18 LAB — THYROID PANEL WITH TSH
Free Thyroxine Index: 2 (ref 1.4–3.8)
T3 Uptake: 30 % (ref 22–35)
T4, Total: 6.8 ug/dL (ref 4.5–12.0)
TSH: 0.703 u[IU]/mL (ref 0.350–4.500)

## 2014-02-18 LAB — COMPREHENSIVE METABOLIC PANEL
ALBUMIN: 4 g/dL (ref 3.5–5.2)
ALT: 12 U/L (ref 0–35)
AST: 16 U/L (ref 0–37)
Alkaline Phosphatase: 42 U/L (ref 39–117)
BILIRUBIN TOTAL: 0.3 mg/dL (ref 0.2–1.2)
BUN: 18 mg/dL (ref 6–23)
CALCIUM: 9.3 mg/dL (ref 8.4–10.5)
CHLORIDE: 104 meq/L (ref 96–112)
CO2: 26 mEq/L (ref 19–32)
Creat: 0.8 mg/dL (ref 0.50–1.10)
Glucose, Bld: 76 mg/dL (ref 70–99)
Potassium: 4.7 mEq/L (ref 3.5–5.3)
Sodium: 140 mEq/L (ref 135–145)
TOTAL PROTEIN: 6.6 g/dL (ref 6.0–8.3)

## 2014-02-18 NOTE — Telephone Encounter (Signed)
Left detailed message at number provided that per OV note from Lauro FranklinPatricia Rolen-Grubb, FNP Keflex is for treatment of UTI and cyst. Patient is to continue current antibiotic and return for recheck in 1 week with Lauro FranklinPatricia Rolen-Grubb, FNP. Patietn has recheck scheduled for 11/30 at 2:15pm. Advised to return call with any further questions. See OV note from Lauro FranklinPatricia Rolen-Grubb, FNP below.  P: Reviewed health and wellness pertinent to exam Pap smear taken today Mammogram is due and will schedule Started on Keflex for the sebaceous cyst and UTI - will recheck in 1 week Will follow with labs and culture Counseled on breast self exam, mammography screening, adequate intake of calcium and vitamin D, diet and exercise return annually or prn  An After Visit Summary was printed and given to the patient.  Routing to provider for final review. Patient agreeable to disposition. Will close encounter

## 2014-02-18 NOTE — Telephone Encounter (Signed)
Pt was seen yesterday for a UTI and cyst and was given 1 antibiotic. She wants to know if the antibiotic was for both problems. Please leave a detailed message because it is hard for her to answer her phone at work.

## 2014-02-19 ENCOUNTER — Telehealth: Payer: Self-pay

## 2014-02-19 LAB — URINE CULTURE

## 2014-02-19 LAB — IPS PAP TEST WITH HPV

## 2014-02-19 NOTE — Telephone Encounter (Signed)
Left message to call Kaitlyn at [phone]. 

## 2014-02-19 NOTE — Telephone Encounter (Signed)
Left message to call Ines Warf at [phone]. 

## 2014-02-19 NOTE — Progress Notes (Signed)
Patty, just FYI.  Weight gain is probably related to stopping Topamax as usually there is some weight loss when on it.  Reviewed personally.  Lum KeasM. Suzanne Makaya Juneau, MD.

## 2014-02-19 NOTE — Telephone Encounter (Signed)
Spoke with patient. Advised patient of results as seen below. Patient is agreeable and verbalizes understanding. Patient states that she is not having any current symptoms and would like to stay on Keflex for now. Patient is coming in for recheck with Lauro FranklinPatricia Rolen-Grubb, FNP on 11/30. Knows if symptoms worsen or anything changes over the weekend to call our office to speak with MD on call. Patient would like to schedule colpo appointment when she comes in for recheck on Monday as she is traveling and does not have her calendar with her at this time.   Will keep in hold to make sure she schedules colpo.  Routing to provider for final review. Patient agreeable to disposition. Will close encounter

## 2014-02-19 NOTE — Telephone Encounter (Signed)
-----   Message from Annamaria BootsMary Suzanne Miller, MD sent at 02/19/2014 11:22 AM EST ----- Inform pt pap is normal but HR HPV is positive.  Needs colposcopy.  Also, urine culture is + for e coli.  On Keflex initially.  If symptoms are better, continue keflex.  If not, switch to bactrim DS x 5 days.  Repeat culture 2 weeks.  I have not placed any orders.

## 2014-02-24 ENCOUNTER — Encounter: Payer: Self-pay | Admitting: Nurse Practitioner

## 2014-02-24 ENCOUNTER — Ambulatory Visit (INDEPENDENT_AMBULATORY_CARE_PROVIDER_SITE_OTHER): Payer: 59 | Admitting: Nurse Practitioner

## 2014-02-24 VITALS — BP 100/70 | HR 76 | Ht 64.25 in | Wt 159.0 lb

## 2014-02-24 DIAGNOSIS — N907 Vulvar cyst: Secondary | ICD-10-CM

## 2014-02-24 NOTE — Patient Instructions (Signed)
Recheck urine in 1 week.

## 2014-02-24 NOTE — Progress Notes (Signed)
Subjective:     Patient ID: Blima RichMelanie Deroo, female   DOB: 18-Mar-1956, 58 y.o.   MRN: [id number]  HPI  This 58 yo WD Fe comes back in today for a recheck on a sebaceous cyst located below the left labia.  She has completed Keflex and feels well.  Feels like that cyst area is gone and thinks maybe it was only an outbreak of HSV.  She has been diagnosed with HSV genital and thought she had an outbreak a few week ago - took Valtrex. She was also having some urinary frequency and feels like those symptoms have improved as well. Denies fever and chills.  She also reports that the discontinuation of Topamax was 10 years ago ? and not recently as I had noted.  We have reviewed her labs done at [facility] and all was normal including thyroid, CMP, etc.  She still wonders as to cause of weight gain and request diet med's.    Review of Systems  Constitutional: Positive for appetite change and fatigue. Negative for fever, chills and diaphoresis.  Respiratory: Negative.   Cardiovascular: Negative.   Gastrointestinal: Negative.   Genitourinary: Negative for dysuria, urgency, frequency, hematuria, flank pain, vaginal bleeding, vaginal discharge, vaginal pain and pelvic pain.  Musculoskeletal: Negative.   Neurological: Negative.   Psychiatric/Behavioral: Negative.        Objective:   Physical Exam  Constitutional: She appears well-developed and well-nourished. No distress.  Pulmonary/Chest: Effort normal.  Abdominal: Soft. She exhibits no distension. There is no tenderness. There is no rebound and no guarding.  Genitourinary:     The area of sebaceous cyst is only barley palpable deep into the buttock.  No redness, soreness, or exudate.       Assessment:     Most likely a sebaceous cyst that is resolving UTI also improved    Plan:     Will get TOC with a nurse visit in a week and follow Call back if cyst area becomes enlarged or painful again. She will follow with PCP with a new patient apt.  soon.

## 2014-02-25 NOTE — Progress Notes (Signed)
Note states pt was in distress.  Please correct.    Reviewed personally.  Lum KeasM. Suzanne Elliona Doddridge, MD.

## 2014-02-25 NOTE — Progress Notes (Signed)
Reviewed personally.  M. Suzanne Mykaylah Ballman, MD.  

## 2014-03-03 ENCOUNTER — Ambulatory Visit
Admission: RE | Admit: 2014-03-03 | Discharge: 2014-03-03 | Disposition: A | Discharge status: Home / Self Care | Payer: 59 | Source: Ambulatory Visit

## 2014-03-03 ENCOUNTER — Ambulatory Visit (INDEPENDENT_AMBULATORY_CARE_PROVIDER_SITE_OTHER): Payer: 59 | Admitting: *Deleted

## 2014-03-03 VITALS — BP 116/60 | HR 80 | Resp 16 | Ht 64.25 in | Wt 158.0 lb

## 2014-03-03 DIAGNOSIS — N3001 Acute cystitis with hematuria: Secondary | ICD-10-CM

## 2014-03-03 DIAGNOSIS — Z1231 Encounter for screening mammogram for malignant neoplasm of breast: Secondary | ICD-10-CM

## 2014-03-03 NOTE — Progress Notes (Signed)
Patient in today for TOC. Patient states she is done with Abx. She denies any UTI sx.  Advise pt to call if sx come back. - Pt agreed.

## 2014-03-04 LAB — URINE CULTURE
Colony Count: NO GROWTH
Organism ID, Bacteria: NO GROWTH

## 2014-03-05 ENCOUNTER — Other Ambulatory Visit: Payer: Self-pay | Admitting: Nurse Practitioner

## 2014-03-05 DIAGNOSIS — R928 Other abnormal and inconclusive findings on diagnostic imaging of breast: Secondary | ICD-10-CM

## 2014-03-19 ENCOUNTER — Ambulatory Visit
Admission: RE | Admit: 2014-03-19 | Discharge: 2014-03-19 | Disposition: A | Discharge status: Home / Self Care | Payer: 59 | Source: Ambulatory Visit | Attending: Nurse Practitioner | Admitting: Nurse Practitioner

## 2014-03-19 DIAGNOSIS — R928 Other abnormal and inconclusive findings on diagnostic imaging of breast: Secondary | ICD-10-CM

## 2014-04-11 ENCOUNTER — Telehealth: Payer: Self-pay

## 2014-04-11 DIAGNOSIS — B977 Papillomavirus as the cause of diseases classified elsewhere: Secondary | ICD-10-CM

## 2014-04-11 NOTE — Telephone Encounter (Signed)
Left message to call Kaitlyn at [phone]. Need to check with patient in regards to scheduling colpo.    Jannet AskewKaitlyn E Hines, RN at 02/19/2014 5:01 PM     Status: Signed       Expand All Collapse All   Spoke with patient. Advised patient of results as seen below. Patient is agreeable and verbalizes understanding. Patient states that she is not having any current symptoms and would like to stay on Keflex for now. Patient is coming in for recheck with Lauro FranklinPatricia Rolen-Grubb, FNP on 11/30. Knows if symptoms worsen or anything changes over the weekend to call our office to speak with MD on call. Patient would like to schedule colpo appointment when she comes in for recheck on Monday as she is traveling and does not have her calendar with her at this time.   Will keep in hold to make sure she schedules colpo.  Routing to provider for final review. Patient agreeable to disposition. Will close encounter

## 2014-04-11 NOTE — Telephone Encounter (Addendum)
Notes Recorded by Jannet AskewKaitlyn E Hines, RN on 02/19/2014 at 5:11 PM Spoke with patient. Results given. See telephone encounter from 02/19/2014. Notes Recorded by Jannet AskewKaitlyn E Hines, RN on 02/19/2014 at 4:30 PM Left message to call Kaitlyn at [phone].  Notes Recorded by Jannet AskewKaitlyn E Hines, RN on 02/19/2014 at 11:58 AM Left message to call Kaitlyn at [phone].  Notes Recorded by Annamaria BootsMary Suzanne Miller, MD on 02/19/2014 at 11:22 AM Inform pt pap is normal but HR HPV is positive. Needs colposcopy. Also, urine culture is + for e coli. On Keflex initially. If symptoms are better, continue keflex. If not, switch to bactrim DS x 5 days. Repeat culture 2 weeks. I have not placed any orders.

## 2014-04-16 NOTE — Telephone Encounter (Signed)
PR: $378.97 °

## 2014-04-16 NOTE — Telephone Encounter (Signed)
Spoke with patient. Patient states she does not remember getting pap results. Readvised patient of results from 02/19/14 phone call. Results seen below from Dr.Miller. Patient is agreeable and verbalizes understanding. Patient would like to know about insurance coverage for procedure before scheduling. Patient states that everything on card from last year is still the same except the plan is now [employer]. Advised will place order and have it precerted. Will return call with further information. Patient is agreeable.   Jannet AskewKaitlyn E Hines, RN at 02/19/2014 11:55 AM     Status: Signed       Expand All Collapse All   ----- Message from Annamaria BootsMary Suzanne Miller, MD sent at 02/19/2014 11:22 AM EST ----- Inform pt pap is normal but HR HPV is positive. Needs colposcopy. Also, urine culture is + for e coli. On Keflex initially. If symptoms are better, continue keflex. If not, switch to bactrim DS x 5 days. Repeat culture 2 weeks. I have not placed any orders.     Cc: Cathrine MusterSabrina Franklin for precert

## 2014-04-17 ENCOUNTER — Telehealth: Payer: Self-pay

## 2014-04-17 NOTE — Telephone Encounter (Signed)
Spoke with patient. Advised that per benefit quote received, she will be responsible to pay $378.97 when she comes in for colpo. Patient apprehensive about scheduling. [facility] patient questions.

## 2014-04-17 NOTE — Telephone Encounter (Signed)
That is not the recommendation and I do not think that I can say it is ok but she needs to do what is best for her and I will be here ready to take care of her when she is ready.  Payment plan is ok.

## 2014-04-17 NOTE — Telephone Encounter (Signed)
Spoke with patient. Patient states that she is currently paying off three other doctor's bills. "I don't have it all upfront right now. If I could wait even 3 months that would help me." Advised patient usually recommend having this done as soon as possible for follow up. "If I could wait 3-4 months I would have some other bills paid off and could come." Advised will need to speak with Dr.Miller regarding time frame and return call with further recommendations. Patient is agreeable.

## 2014-04-17 NOTE — Telephone Encounter (Addendum)
Left message to call Damon Baisch at [phone]. Need to advised message from Dr.Miller. Advised spoke with Shanda BumpsJessica in the billing department and will be responsible for 1/3 of the $378.97 up front at appointment. The remainder will need to be paid over the next two months.

## 2014-04-17 NOTE — Telephone Encounter (Signed)
Encounter opened in error

## 2014-04-24 NOTE — Telephone Encounter (Signed)
Spoke with patient. Advised patient of message as seen below from Dr.Miller. Patient is agreeable to schedule based upon payment plan. Per Shanda BumpsJessica payment may have 3 month or 4 month plan. Patient is agreeable. Advised will need to speak with billing department in regards to setting up payments and costs. Patient is agreeable. Appointment scheduled for 2/18 at 10am with Dr.Miller. Patient is agreeable to date and time. Instructions given. Motrin 800 mg po x , one hour before appointment with food. Make sure to eat a meal before appointment and drink plenty of fluids. Patient verbalized understanding and will call to reschedule if will be on menses or has any concerns regarding pregnancy. Advised will need to cancel within 24 hours or will have $150.00 late cancellation fee placed to account. Patient agreeable and verbalized understanding of all instructions.   Routing to provider for final review. Patient agreeable to disposition. Will close encounter

## 2014-05-15 ENCOUNTER — Ambulatory Visit (INDEPENDENT_AMBULATORY_CARE_PROVIDER_SITE_OTHER): Payer: 59 | Admitting: Obstetrics & Gynecology

## 2014-05-15 DIAGNOSIS — B977 Papillomavirus as the cause of diseases classified elsewhere: Secondary | ICD-10-CM

## 2014-05-15 DIAGNOSIS — R87820 Cervical low risk human papillomavirus (HPV) DNA test positive: Secondary | ICD-10-CM | POA: Diagnosis not present

## 2014-05-15 NOTE — Progress Notes (Signed)
59 y.o. 55P2 Divorced White female here for colposcopy with possible biopsies and/or ECC due to neg Pap with + HR HPV Pap obtained at [facility] with Ria CommentPatricia Grubb 02/17/14.  Pt has remote hx of abnormal Pap smear treated with LEEP 2007.  She did have + HPV testing at that time.  She has had normal Pap smears since but is not sure she has undergone repeat HVP testing.  Pt is engaged to be married and will most likely move within the next year to [place].      Patient's last menstrual period was 02/17/2006.          Sexually active: No.  The current method of family planning is post menopausal status.     Patient has been counseled about results and procedure.  Risks and benefits have bene reviewed including immediate and/or delayed bleeding, infection, cervical scaring from procedure, possibility of needing additional follow up as well as treatment.  rare risks of missing a lesion discussed as well.  All questions answered.  Pt ready to proceed.  BP 104/62 mmHg  Pulse 68  Resp 16  Wt 159 lb 3.2 oz (72.213 kg)  LMP 02/17/2006  General appearance: alert, cooperative and appears stated age Head: Normocephalic, without obvious abnormality, atraumatic Neurologic: Grossly normal  Pelvic: External genitalia:  no lesions              Urethra:  normal appearing urethra with no masses, tenderness or lesions              Bartholins and Skenes: normal                 Vagina: normal appearing vagina with normal color and discharge, no lesions              Cervix: no lesions              Pap taken: No.  HR HPV 16/18 testing obtained.  Speculum placed.  3% acetic acid applied to cervix for >45 seconds.  Cervix visualized with both 7.5X and 15X magnification.  Green filter also used.  Lugols solution was used.  Findings:  Small area of decreased staining with Lugol's solution at 12 o'clock.  At 3 o'clock position this biopsy was ectocervical and towards edge of cervix.  No AWE noted.  Biopsy:  12 and 3 o'cloc.   ECC:  was performed.  Monsel's was needed.  Excellent hemostasis was present.  Pt tolerated procedure well and all instruments were removed.  Findings noted above on picture of cervix.  Assessment:  +HR HPV with neg Pap, h/o LEEP 2007  Plan:  Biopsies and ECC as well as 16/18 testing pending.  Pathology results will be called to patient and follow-up planned pending results.

## 2014-05-17 LAB — IPS HPV GENOTYPING 16/18

## 2014-05-19 LAB — IPS OTHER TISSUE BIOPSY

## 2014-05-20 ENCOUNTER — Telehealth: Payer: Self-pay

## 2014-05-20 NOTE — Telephone Encounter (Signed)
Lmtcb//kn 

## 2014-05-20 NOTE — Telephone Encounter (Signed)
-----   Message from [place]own[place] E Amundson de Gwenevere Ghaziarvalho E Silva, MD sent at 05/19/2014 11:25 PM EST ----- Please inform patient of her colposcopic biopsy results showing LGSIL.  ECC was negative (inside the cervical channel).  Her subtyping of the HR HPV were negative for types 16, 18, and 45.   I anticipate Dr. Hyacinth MeekerMiller may recommend a repeat pap and HR HPV testing in one year.  She will need to review results upon her return to the office next week.

## 2015-01-20 DIAGNOSIS — D6800 Von Willebrand disease, unspecified: Secondary | ICD-10-CM | POA: Insufficient documentation

## 2015-01-27 HISTORY — PX: COLPORRHAPHY: SHX921

## 2015-02-26 ENCOUNTER — Ambulatory Visit: Payer: 59 | Admitting: Nurse Practitioner

## 2015-02-27 ENCOUNTER — Telehealth: Payer: Self-pay | Admitting: Obstetrics & Gynecology

## 2015-02-27 NOTE — Telephone Encounter (Signed)
Patient returning call aware that we have not heard from Dr. Hyacinth MeekerMiller she asked if you could call her cell [phone].

## 2015-02-27 NOTE — Telephone Encounter (Addendum)
Spoke with patient after speaking with Billie RuddySally Yeakley, RN. Advised patient will need to move her aex to after her 6 weeks post op. Advised we want to ensure she has had enough time to heal and is cleared from her post op care. Patient is agreeable. Appointment rescheduled to 03/17/2015 at 3:30 pm with Dr.Miller. Agreeable to date and time.  Routing to provider for final review. Patient agreeable to disposition. Will close encounter.

## 2015-02-27 NOTE — Telephone Encounter (Signed)
Spoke with patient. Patient states that she had a rectocele repair on 01/27/2015. Was originally told that she could not place anything in her vagina for 4 weeks, but her paper work states 6 weeks. "I am not sure which to go by. I feel fine and don't anticipate any pain. I just want to make sure she can insert a speculum." Advised I will speak with Dr.Miller regarding her recommendations and return call. Patient is agreeable.

## 2015-02-27 NOTE — Telephone Encounter (Signed)
Patient calling requesting to speak to the nurse. She said, "I just had surgery on 01/27/15 [facility]. I have an appointment with Dr. Hyacinth MeekerMiller for my annual exam on 03/02/15 but I am not supposed to place anything in my vagina for 6 weeks after surgery. Should I reschedule to a later date? I feel fine."

## 2015-03-02 ENCOUNTER — Ambulatory Visit: Payer: 59 | Admitting: Obstetrics & Gynecology

## 2015-03-03 ENCOUNTER — Other Ambulatory Visit: Payer: Self-pay

## 2015-03-03 DIAGNOSIS — Z1231 Encounter for screening mammogram for malignant neoplasm of breast: Secondary | ICD-10-CM

## 2015-03-17 ENCOUNTER — Encounter: Payer: Self-pay | Admitting: Obstetrics & Gynecology

## 2015-03-17 ENCOUNTER — Ambulatory Visit (INDEPENDENT_AMBULATORY_CARE_PROVIDER_SITE_OTHER): Payer: 59 | Admitting: Obstetrics & Gynecology

## 2015-03-17 VITALS — BP 118/64 | HR 74 | Resp 18 | Ht 64.25 in | Wt 169.0 lb

## 2015-03-17 DIAGNOSIS — R87811 Vaginal high risk human papillomavirus (HPV) DNA test positive: Secondary | ICD-10-CM

## 2015-03-17 DIAGNOSIS — R159 Full incontinence of feces: Secondary | ICD-10-CM

## 2015-03-17 DIAGNOSIS — R300 Dysuria: Secondary | ICD-10-CM | POA: Diagnosis not present

## 2015-03-17 DIAGNOSIS — R87620 Atypical squamous cells of undetermined significance on cytologic smear of vagina (ASC-US): Secondary | ICD-10-CM

## 2015-03-17 DIAGNOSIS — Z01419 Encounter for gynecological examination (general) (routine) without abnormal findings: Secondary | ICD-10-CM

## 2015-03-17 DIAGNOSIS — M810 Age-related osteoporosis without current pathological fracture: Secondary | ICD-10-CM

## 2015-03-17 DIAGNOSIS — Z23 Encounter for immunization: Secondary | ICD-10-CM

## 2015-03-17 NOTE — Progress Notes (Signed)
59 y.o. [id number] MarriedCaucasianF here for annual exam.  Doing well.  Reports she had a posterior repair with Dr. Gala Lewandowsky on 01/27/15 at [facility].  Denies vaginal bleeding.  Reports she is doing well.  Reports she is having some issues with fecal incontinence.  Has soiling at times, especially if she has fecal urgency.  This seems new to her but she's not sure it is related to her surgery.  Reports this is worse if she has a very soft or loose BM.  Reports she is seeing a hematologist at [facility] for additional evalaution of her Von Willibrand's disease.  She reports her only issue with this is increased bleeding.    PCP:  Dr. Darlin Priestly.  Also sees provider at [facility] in [place].  Patient's last menstrual period was 02/17/2006.          Sexually active: Yes.    The current method of family planning is post menopausal status.    Exercising: Yes.    Aerobics, and weights Smoker:  no  Health Maintenance: Pap:  02/17/14 Neg, +HR HPV, neg 16/18/45 History of abnormal Pap:  Yes. LEEP 2007 MMG:  03/19/14 BIRADS1:neg Colonoscopy:  09/2012 repeat 7 years  BMD:   04/23/12, -1.4/-2.8 TDaP: 11/17/2003 Screening Labs: PCP, Hb today: PCP, Urine today: PCP   reports that she quit smoking about 15 years ago. Her smoking use included Cigarettes. She has a 2 pack-year smoking history. She has never used smokeless tobacco. She reports that she drinks about 1.2 oz of alcohol per week. She reports that she does not use illicit drugs.  Past Medical History  Diagnosis Date  . Allergy   . Anemia 09/2004    iron deficient  . Depression     Last psychiatrist - Dr. Melrose Nakayama - does not have records as seen prior to 2007  . Osteoporosis 02/2006    DEXA 04/23/12 w/ T score L femur -2.8 and L spine -1.4  . Von Willebrand's disease (HCC) 03/2005  . Migraine   . Abnormal glandular Papanicolaou smear of cervix 06/2005    followed by LEEP, + HR HPV DNA 06/2008  . Thyroid disease 01/2007   subclinical hyperthyroidism  . Foot fracture, left 11/2006    5th MTP    Past Surgical History  Procedure Laterality Date  . Tmj arthroscopy    . Tonsillectomy and adenoidectomy    . Leep  2007  . Colonoscopy N/A 10/15/2012    Procedure: COLONOSCOPY;  Surgeon: Charna Elizabeth, MD;  Location: [facility];  Service: Endoscopy;  Laterality: N/A;  . Colporrhaphy  01/27/15    Poster[facility]- [facility]     Family History  Problem Relation Age of Onset  . Osteoporosis Mother   . Heart Problems Mother   . Cancer Father   . Ulcers Father   . Osteoporosis Maternal Grandmother   . Alzheimer's disease Maternal Grandfather   . Depression Daughter   . Alcohol abuse Father   . Thyroid disease Mother   . Psychiatric Illness Brother     ROS:  Pertinent items are noted in HPI.  Otherwise, a comprehensive ROS was negative.  Exam:   BP 118/64 mmHg  Pulse 74  Resp 18  Ht 5' 4.25" (1.632 m)  Wt 169 lb (76.658 kg)  BMI 28.78 kg/m2  LMP 02/17/2006  Weight change: +10#   Height: 5' 4.25" (163.2 cm)  Ht Readings from Last 3 Encounters:  03/17/15 5' 4.25" (1.632 m)  03/03/14 5' 4.25" (1.632 m)  02/24/14 5' 4.25" (1.632 m)    General appearance: alert, cooperative and appears stated age Head: Normocephalic, without obvious abnormality, atraumatic Neck: no adenopathy, supple, symmetrical, trachea midline and thyroid normal to inspection and palpation Lungs: clear to auscultation bilaterally Breasts: normal appearance, no masses or tenderness Heart: regular rate and rhythm Abdomen: soft, non-tender; bowel sounds normal; no masses,  no organomegaly Extremities: extremities normal, atraumatic, no cyanosis or edema Skin: Skin color, texture, turgor normal. No rashes or lesions Lymph nodes: Cervical, supraclavicular, and axillary nodes normal. No abnormal inguinal nodes palpated Neurologic: Grossly normal   Pelvic: External genitalia:  no lesions              Urethra:  normal appearing urethra  with no masses, tenderness or lesions              Bartholins and Skenes: normal                 Vagina: normal appearing vagina with normal color and discharge, no lesions, suture line intact without any sutures still present              Cervix: no lesions              Pap taken: Yes.   Bimanual Exam:  Uterus:  normal size, contour, position, consistency, mobility, non-tender              Adnexa: normal adnexa and no mass, fullness, tenderness               Rectovaginal: Confirms               Anus:  Decreased sphincter tone, no lesions  Chaperone was present for exam.  A:  Well Woman with normal exam PMP, no HRT Atrophic vaginitis  Recurrent UTI, improved with use of small amount of vaginal estrogen one time weekly Fecal incontinence, new issue. History of Von Willeband's disease.  [facility]. Osteoporosis followed by PCP History of LEEP for abnormal pap 2007.  Neg pap with +HR HPV, neg 16/18/45 2015.  H/O HSV  P:  Mammogram yearly.  Pt will call and schedule BMD.  Order placed.   Pap smear and HR HPV today.  May need to reflex 16/18/45 depending on results  Pt uses Valtrex prn.  No Rx needed.  Tdap will be given today  D/w pt possible treatment options for fecal incontinence including bulking or decreased motility agents.  Pt not interested in this.  PT discussed with possible locations for referral.  She will consider and let me know if desires referral.    In addition to [facility], another 10 minutes was spent in direct face to face discussion on her fecal incontinence, physical exam findings, possible treatment options.

## 2015-03-20 DIAGNOSIS — Z01419 Encounter for gynecological examination (general) (routine) without abnormal findings: Secondary | ICD-10-CM | POA: Insufficient documentation

## 2015-03-20 LAB — IPS PAP TEST WITH HPV

## 2015-03-20 NOTE — Addendum Note (Signed)
Addended by: Jerene BearsMILLER, Rozena Fierro S on: 03/20/2015 02:24 PM   Modules accepted: Orders, SmartSet

## 2015-03-26 ENCOUNTER — Telehealth: Payer: Self-pay | Admitting: Emergency Medicine

## 2015-03-26 DIAGNOSIS — IMO0002 Reserved for concepts with insufficient information to code with codable children: Secondary | ICD-10-CM

## 2015-03-26 NOTE — Telephone Encounter (Signed)
Notes Recorded by Jerene BearsMary S Miller, MD on 03/25/2015 at 8:02 AM Please let pt know that her Pap was ASCUS with +HR HPV and she needs a repeat colposcopy. Notes Recorded by Jerene BearsMary S Miller, MD on 03/20/2015 at 2:24 PM ASCUS pap with +HR HPV. Need to add 16/18/45 testing. Order placed.

## 2015-03-26 NOTE — Telephone Encounter (Signed)
Spoke with patient and she is advised of results of PAP smear and HPV results with message from Dr. Hyacinth MeekerMiller.   She states that she has a new insurance plan effective 03/29/15 and would like to know what coverage will be for procedure prior to scheduling. Patient states she will call when she receives her new card.  16/18 subtyping is pending per Dr. Hyacinth MeekerMiller.  Order placed for colposcopy for precert.  cc Harland DingwallSuzy Dixon for insurance pre-certification and patient contact. Please note patient will be providing new insurance coverage information.  One month recall placed.   Routing to provider for final review. Patient agreeable to disposition. Will close encounter.

## 2015-03-26 NOTE — Telephone Encounter (Signed)
-----   Message from Jerene BearsMary S Miller, MD sent at 03/25/2015  8:02 AM EST ----- Please let pt know that her Pap was ASCUS with +HR HPV and she needs a repeat colposcopy.

## 2015-03-27 LAB — IPS HPV GENOTYPING 16/18

## 2015-04-07 ENCOUNTER — Ambulatory Visit: Payer: Self-pay

## 2015-04-08 ENCOUNTER — Telehealth: Payer: Self-pay | Admitting: Obstetrics & Gynecology

## 2015-04-08 NOTE — Telephone Encounter (Signed)
Spoke with pt regarding benefit for colposcopy. Patient understood and agreeable. Patient ready to schedule. Patient scheduled 04/14/15 with Dr Hyacinth MeekerMiller. Pt aware of arrival date and time. Pt aware of 72 hours cancellation policy with $150 fee. No further questions. Ok to close

## 2015-04-14 ENCOUNTER — Ambulatory Visit (INDEPENDENT_AMBULATORY_CARE_PROVIDER_SITE_OTHER): Payer: 59 | Admitting: Obstetrics & Gynecology

## 2015-04-14 VITALS — BP 108/62 | HR 80 | Resp 16 | Ht 64.25 in | Wt 169.0 lb

## 2015-04-14 DIAGNOSIS — R87620 Atypical squamous cells of undetermined significance on cytologic smear of vagina (ASC-US): Secondary | ICD-10-CM

## 2015-04-14 DIAGNOSIS — R87811 Vaginal high risk human papillomavirus (HPV) DNA test positive: Secondary | ICD-10-CM | POA: Diagnosis not present

## 2015-04-14 DIAGNOSIS — IMO0002 Reserved for concepts with insufficient information to code with codable children: Secondary | ICD-10-CM

## 2015-04-14 DIAGNOSIS — R896 Abnormal cytological findings in specimens from other organs, systems and tissues: Secondary | ICD-10-CM

## 2015-04-14 NOTE — Patient Instructions (Signed)

## 2015-04-14 NOTE — Progress Notes (Signed)
60 y.o. Divorced Not Hispanic or Latino female here for colposcopy with possible biopsies and/or ECC due to ASCUS pap with +HR HPV Pap obtained 03/17/15.  16/18/45 testing was negative.   Pt has hx of  LEEP for abnormal pap 2007. Also, she has a neg pap with +HR HPV in 12/15 with neg 16/18/45.   Pt has many questions about HPV today.  These were answered.  Procedure reviewed.  Pt has had colposcopies in the past but appreciated the review again.  Future and possible management and treatment options reviewed with her as well.    Patient's last menstrual period was 02/17/2006.          Sexually active: yes The current method of family planning is post menopausal status.     Patient has been counseled about results and procedure.  Risks and benefits have bene reviewed including immediate and/or delayed bleeding, infection, cervical scaring from procedure, possibility of needing additional follow up as well as treatment.  rare risks of missing a lesion discussed as well.  All questions answered.  Pt ready to proceed.  BP 108/62 mmHg  Pulse 80  Resp 16  Ht 5' 4.25" (1.632 m)  Wt 169 lb (76.658 kg)  BMI 28.78 kg/m2  LMP 02/17/2006  General appearance: alert, cooperative and appears stated age Head: Normocephalic, without obvious abnormality, atraumatic Neurologic: Grossly normal  Pelvic: External genitalia:  no lesions              Urethra:  normal appearing urethra with no masses, tenderness or lesions              Bartholins and Skenes: normal                 Vagina: normal appearing vagina with normal color and discharge, no lesions              Cervix: no lesions              Pap taken: No.  Speculum placed.  3% acetic acid applied to cervix for >45 seconds.  Cervix visualized with both 7.5X and 15X magnification.  Green filter also used.  Lugols solution was used.  Findings:  No AWE noted.  No discrete areas of decreased Lugol's staining.  The entire left side of the cervix did not pick  up the stain as well as the right but this was not a well demarcated lesion.  This appeared more like atrophic changes and, therefore, no biopsies were obtained.  ECC:  was performed.  Monsel's was not needed.  Excellent hemostasis was present.  Pt tolerated procedure well and all instruments were removed.    Assessment:  ASCUS pap with +HR HPV H/O rectocele repair at [facility] in 2016  Plan:  Pathology results will be called to patient and follow-up planned pending results.

## 2015-04-16 DIAGNOSIS — IMO0002 Reserved for concepts with insufficient information to code with codable children: Secondary | ICD-10-CM | POA: Insufficient documentation

## 2015-04-16 LAB — IPS OTHER TISSUE BIOPSY

## 2015-04-17 ENCOUNTER — Telehealth: Payer: Self-pay | Admitting: Emergency Medicine

## 2015-04-17 NOTE — Telephone Encounter (Signed)
-----   Message from Jerene Bears, MD sent at 04/16/2015  3:41 PM EST ----- Please let pt know that the ECC showed Grade 1 dysplasia/mild dysplasia but that the glandular cells were normal.  Ok to watch and repeat Pap and HR HPV 1 year.  08 recall.

## 2015-04-17 NOTE — Telephone Encounter (Signed)
Patient returned call. She is notified of message from Dr. Hyacinth Meeker.  Agreeable to follow up in one year as recommended by Dr. Hyacinth Meeker.  08 recall in place. Annual exam scheduled previously at last office visit.  Routing to provider for final review. Patient agreeable to disposition. Will close encounter.

## 2015-04-17 NOTE — Telephone Encounter (Signed)
Message left to return call to Leisa Gault at [phone].   08 recall placed.   

## 2015-04-28 ENCOUNTER — Ambulatory Visit
Admission: RE | Admit: 2015-04-28 | Discharge: 2015-04-28 | Disposition: A | Discharge status: Home / Self Care | Payer: 59 | Source: Ambulatory Visit | Attending: Obstetrics & Gynecology | Admitting: Obstetrics & Gynecology

## 2015-04-28 ENCOUNTER — Ambulatory Visit
Admission: RE | Admit: 2015-04-28 | Discharge: 2015-04-28 | Disposition: A | Discharge status: Home / Self Care | Payer: Self-pay | Source: Ambulatory Visit

## 2015-04-28 DIAGNOSIS — Z1231 Encounter for screening mammogram for malignant neoplasm of breast: Secondary | ICD-10-CM

## 2015-04-28 DIAGNOSIS — M810 Age-related osteoporosis without current pathological fracture: Secondary | ICD-10-CM

## 2015-05-07 ENCOUNTER — Ambulatory Visit (INDEPENDENT_AMBULATORY_CARE_PROVIDER_SITE_OTHER): Payer: 59 | Admitting: Obstetrics & Gynecology

## 2015-05-07 VITALS — BP 92/66 | HR 80 | Resp 16 | Ht 64.25 in | Wt 176.0 lb

## 2015-05-07 DIAGNOSIS — M810 Age-related osteoporosis without current pathological fracture: Secondary | ICD-10-CM

## 2015-05-08 LAB — PTH, INTACT AND CALCIUM
Calcium: 10.3 mg/dL (ref 8.4–10.5)
PTH: 16 pg/mL (ref 14–64)

## 2015-05-12 ENCOUNTER — Encounter: Payer: Self-pay | Admitting: Obstetrics & Gynecology

## 2015-05-12 NOTE — Progress Notes (Signed)
Subjective:    44 yrs Divorced Caucasian [id number]  female here to discuss recent BMD obtained 04/28/15 showing osteoporosis in hip femoral neck with T score-3.1.  She has mild osteopenia in her hip with T score -1.3.  Prior BMD was 04/23/12 with T score in femoral neck of -2.8.  Frature       Osteoporosis Risk Factors  Nonmodifiable Personal Hx of fracture as an adult: no Hx of fracture in first-degree relative: no Caucasian race: yes Advanced age: no Female sex: yes Dementia: no Poor health/frailty: no  Potentially modifiable: Tobacco use: no Low body weight (<127 lbs): no Estrogen deficiency  early menopause (age <45) or bilateral ovariectomy: no  prolonged premenopausal amenorrhea (>1 yr): no Low calcium intake (lifelong): no Alcohol use more than 2 drinks per day: no Recurrent falls: no Inadequate physical activity: aerobics and weights 3 times weekly  Current calcium and Vit D intake:  and 600IU vit D  Review of Systems A comprehensive review of systems was negative.     Objective:   PHYSICAL EXAM BP 92/66 mmHg  Pulse 80  Resp 16  Ht 5' 4.25" (1.632 m)  Wt 176 lb (79.833 kg)  BMI 29.97 kg/m2  LMP 02/17/2006 General appearance: alert and cooperative  Imaging Bone Density: Spine T Score: -1.3, Hip T Score: -3.1 done on 04/28/15 FRAX score:  Not calculated due to osteoporosis          Discussion:  Pt and I reviewed findings and comparison to prior Dexa scan done 2014.  With change, really feel like treatment is indicated.  Reviewed medications with pt including risks and benefits.  She is most interested in Prolia.  Secondary causes of osteoporosis reviewed.  Pt has had recent TSH, Vit D, CMP testing.  Will check PTH today.  Pt knows to work on a minimum of weight bearing exercise at least 3 times a week.  Calcium and Vit D intake reviewed as well.  Pt is a former smoker but quit in 2001.                        Assessment:   Osteoporosis with T score -3.1 in  femoral neck   Plan:   1.  Patient counseled in adequate calcium and vitamin D exposure.  Calcium - 500 - 1000 mg elemental calcium/day in divided doses  Vitamin D - 800 IU/day 2.  Exercise recommended at least 30 minutes 3 times per week.  3.  Labs today:  PTH with intact calcium 4.  Fall prevention reviewed 5.  Will work on approval for Prolia 6.  Repeat bone density in 2 years.  ~25 minutes spent with patient >50% of time was in face to face discussion of above.

## 2015-05-18 ENCOUNTER — Telehealth: Payer: Self-pay | Admitting: Emergency Medicine

## 2015-05-18 NOTE — Telephone Encounter (Signed)
Patient returned call and left a message on the answering machine at lunch. She said, "I am on vacation in [place] right now so there is a bit of a time difference. When you give me a call back, please leave any information details on my voice mail if you do not reach me. Phone reception where I am at is not great."

## 2015-05-18 NOTE — Telephone Encounter (Signed)
Call to patient and detailed message left per her request. Advised of normal results of Ca/PTH and that insurance requires trial and failure of oral treatment prior to coverage of Prolia.  Advised of order and instructions from Dr. Hyacinth Meeker, however, did not send in prescription. Advised patient to please call back at her convenience and prescription will be sent in, however, want to ensure she does not have any questions or concerns.

## 2015-05-18 NOTE — Telephone Encounter (Signed)
Message left to return call to Yonael Tulloch at [phone].    

## 2015-05-18 NOTE — Telephone Encounter (Signed)
-----   Message from Jerene Bears, MD sent at 05/13/2015  7:14 AM EST ----- Please inform this is normal.  Prior authorization for prolia is needed.  Thanks.

## 2015-05-18 NOTE — Telephone Encounter (Signed)
-----   Message from Jerene Bears, MD sent at 05/18/2015  1:03 PM EST ----- Regarding: RE: Prolia  It's ok if we have to try bisphosphonate first.  You can call her and let he know that.  Fosamax  weekly, in am, on empty stomach with 4 oz water.  Be upright and without anything for one hr.  If starts to have reflux, can go forward with Prolia.  Thanks.  MSM ----- Message -----    From: Joeseph Amor, RN    Sent: 05/18/2015  11:39 AM      To: Jerene Bears, MD Subject: Prolia                                         Dr. Hyacinth Meeker,  I am working on prior authorization for this patient for prolia.  She does not have a paper chart and I am wondering what we can use for for as a reason for not trying and failing bisphosphonates prior to Prolia .

## 2015-05-26 NOTE — Telephone Encounter (Signed)
Patient wanting to talk with French Ana in regards to fosamax Best # to reach: [phone] 8:30-4:30 (w)  [phone] (h)

## 2015-05-27 NOTE — Telephone Encounter (Signed)
Call to patient. She states she was on Fosamax previously and stopped treatment after she heard of lawsuits relating to Fosamax and Femur fractures and bone loss in [place]. Her mother and a friend also broke their femurs while taking Fosamax and prefers not to restart. She reports she did not have any side effects while taking fosamax. She is advised that her insurance requires her to try and fail at least one bisphosphonate prior to covering Prolia.   Patient states she will try another pill, however, prefers not to use Fosamax. Advised will send message to Dr. Hyacinth Meeker and return call with response. Patient agreeable.

## 2015-05-29 NOTE — Telephone Encounter (Signed)
OK to switch to Actonel 35mg  daily.  Can send rx to pharmacy.  Thanks.

## 2015-06-01 MED ORDER — RISEDRONATE SODIUM 35 MG PO TABS: 35.0000 mg | ORAL_TABLET | ORAL | Status: DC

## 2015-06-01 NOTE — Telephone Encounter (Signed)
Please switch to Actonel 35 mg weekly.  Ok to give refills until annual exam is due with Dr. Hyacinth MeekerMiller.

## 2015-06-01 NOTE — Telephone Encounter (Signed)
Order placed for Actonel 35 mg by mouth weekly.   Patient contacted and instructions given to take Take 1 tablet (35 mg total) by mouth every 7 (seven) days. with water on empty stomach, nothing by mouth or lie down for next 30 minutes. Discussed common side effects such as joint pains and GI upset. Patient is instructed to call back if she develops any side effects. Patient agreeable. Will call back with any concerns. Routing to provider for final review. Patient agreeable to disposition. Will close encounter.

## 2015-06-10 ENCOUNTER — Ambulatory Visit: Payer: 59 | Admitting: Neurology

## 2015-08-25 ENCOUNTER — Telehealth: Payer: Self-pay | Admitting: Obstetrics & Gynecology

## 2015-08-25 NOTE — Telephone Encounter (Signed)
Left patient a message to call back and ask for Haley Buchanan re:  Pt had abnormal pap smear in 12/16. Needs AEX in one year. Currently scheduled for 4/18. Can you move up her appt to closer to one year. She is a Runner, broadcasting/film/videoteacher so may want some of that time around the holidays that is currently blocked. We are going to discuss the holidays at the next provider meeting, if we have time. Thanks.

## 2015-08-26 NOTE — Telephone Encounter (Signed)
Patient called back and scheduled for 03/17/16 with Dr. Hyacinth MeekerMiller for her AEX. Routing to Dr. Hyacinth MeekerMiller for review.

## 2015-09-09 ENCOUNTER — Telehealth: Payer: Self-pay | Admitting: Obstetrics & Gynecology

## 2015-09-09 NOTE — Telephone Encounter (Signed)
Spoke with patient. Patient states that she has been feeling more emotional than usual. Reports her mother passed away one month ago and feels this may have something to do with her emotions. "I am just crying more often and more irritable." Patient reports she feels stable and denies any feelings of SI/HI. She feels comfortable waiting to be seen on 09/14/2015 with Dr.Miller. Advised if she would like to be sooner she may contact the office for an earlier appointment with a covering provider. She is agreeable.  Routing to covering provider for final review. Patient agreeable to disposition. Will close encounter.

## 2015-09-09 NOTE — Telephone Encounter (Signed)
Patient called and scheduled an appointment for a consultation with Dr. Hyacinth MeekerMiller for Monday, 09/14/15. She called and requested this appointment stating, "My mother passed away last month and I have been having some concerns I'd like to discuss with Dr. Hyacinth MeekerMiller. It is not urgent I just think I may need something to help me through this time." Patient aware a nurse may call her and check in with her as well.

## 2015-09-14 ENCOUNTER — Ambulatory Visit (INDEPENDENT_AMBULATORY_CARE_PROVIDER_SITE_OTHER): Payer: 59 | Admitting: Obstetrics & Gynecology

## 2015-09-14 VITALS — BP 114/70 | HR 112 | Resp 12 | Ht 64.25 in | Wt 173.8 lb

## 2015-09-14 DIAGNOSIS — R6889 Other general symptoms and signs: Secondary | ICD-10-CM | POA: Diagnosis not present

## 2015-09-14 DIAGNOSIS — F4321 Adjustment disorder with depressed mood: Secondary | ICD-10-CM | POA: Diagnosis not present

## 2015-09-14 LAB — THYROID PANEL WITH TSH
FREE THYROXINE INDEX: 2.8 (ref 1.4–3.8)
T3 UPTAKE: 34 % (ref 22–35)
T4, Total: 8.3 ug/dL (ref 4.5–12.0)
TSH: 0.65 mIU/L

## 2015-09-14 MED ORDER — ALPRAZOLAM 0.25 MG PO TABS: 0.2500 mg | ORAL_TABLET | Freq: Every evening | ORAL | Status: DC | PRN

## 2015-09-14 MED ORDER — FLUOXETINE HCL 20 MG PO TABS: 20.0000 mg | ORAL_TABLET | Freq: Every day | ORAL | Status: DC

## 2015-09-14 NOTE — Progress Notes (Signed)
GYNECOLOGY  VISIT   HPI: 60 y.o. [id number] Divorced and remarried Caucasian female here to discuss mood issues of the last month.  Her mother passed away on on 08/02/15.  She feels this was the "thing" that exacerbated her current depressed mood.  She knows she will have to go through a grief process but this is feeling like "more".  She has a hx of depression and has been treated in the past.  She brought a list of dates for me.  In the mid-2000's, she was treated for several years with a combination of anti-depressants, from a psychiatrists, as well as Topamax and migraine medications, from a neurologist, for headaches.  She reports her actual depression was further in her past than this.  Once she went into menopause, her headaches abated significantly and so she ended up weaning herself off all of the medication by around 2007.  Cymbalta was the medication she was on the longest, in regards to mood.  She finds herself now crying much more easily, being "short" with those around her, having trouble with concentration, and just feeling sad.  She denies any suicidal or homicidal ideations.  Reports sleep is still pretty good.  She is tearful today.  She does not feel angry.  Also reports her marriage is still something she is adjusting to, especially since her husband has such a "crazy" work schedule.  He works five days on and several off so it is hard to really make plans for more than a few days in the future.  There doesn't seem to be any "good" day or night each week to commit to something as a couple.  She reports he is a good spouse and she does care for him very much but the death of her mother is making her feel like she just can't "cope" with some of the things she was before the death.  She is actively seeing a counselor and pleased with this relationship.  Has seen the same woman for "years".  She does admit she has increased her alcohol consumption (upon asking) since her mother died.  This as a  natural depressant is discussed.  States her husband drinks more regular alcohol than she did before she was married, so it is "easy" to do the same but she will be much more cognizant of this consumption.  Denies feeling like she needs help with "cutting back".  Lastly, requests thyroid be tested due to feeling "cold" all the time.  Husband has commented on this "so many times".  GYNECOLOGIC HISTORY: Patient's last menstrual period was 02/17/2006. Contraception: PMP Menopausal hormone therapy: micronized progesterone  Patient Active Problem List   Diagnosis Date Noted  . ASCUS with positive high risk HPV 04/16/2015  . Von Willebrand's disease (HCC) 01/20/2015  . Urinary frequency 09/24/2012  . Osteoporosis 02/04/2012  . Headache, chronic daily 02/04/2012  . Atrophic vaginitis 07/14/2008  . Subclinical thyrotoxicosis 07/30/2007    Past Medical History  Diagnosis Date  . Allergy   . Anemia 09/2004    iron deficient  . Depression     Last psychiatrist - Dr. Melrose NakayamaPascal - does not have records as seen prior to 2007  . Osteoporosis 02/2006    DEXA 04/23/12 w/ T score L femur -2.8 and L spine -1.4  . Von Willebrand's disease (HCC) 03/2005  . Migraine   . Abnormal glandular Papanicolaou smear of cervix 06/2005    followed by LEEP, + HR HPV DNA 06/2008  . Thyroid disease  01/2007    subclinical hyperthyroidism  . Foot fracture, left 11/2006    5th MTP    Past Surgical History  Procedure Laterality Date  . Tmj arthroscopy    . Tonsillectomy and adenoidectomy    . Leep  2007  . Colonoscopy N/A 10/15/2012    Procedure: COLONOSCOPY;  Surgeon: Charna Elizabeth, MD;  Location: [facility];  Service: Endoscopy;  Laterality: N/A;  . Colporrhaphy  01/27/15    Posterior- [facility], Dr. Gala Lewandowsky    MEDS:  Reviewed in EPIC and UTD  ALLERGIES: Macrobid  Family History  Problem Relation Age of Onset  . Osteoporosis Mother   . Heart Problems Mother   . Cancer Father   . Ulcers Father   .  Osteoporosis Maternal Grandmother   . Alzheimer's disease Maternal Grandfather   . Depression Daughter   . Alcohol abuse Father   . Thyroid disease Mother   . Psychiatric Illness Brother     SH:  Divorced and remarried, non smoker  Review of Systems  Psychiatric/Behavioral: Negative for suicidal ideas, hallucinations and memory loss. The patient is not nervous/anxious and does not have insomnia.   All other systems reviewed and are negative.   PHYSICAL EXAMINATION:    BP 114/70 mmHg  Pulse 112  Resp 12  Ht 5' 4.25" (1.632 m)  Wt 173 lb 12.8 oz (78.835 kg)  BMI 29.60 kg/m2  LMP 02/17/2006    General appearance: alert, cooperative and appears stated age Mood:  Depressed, affect normal, tearful Neurology:  Oriented to time, place, date  Chaperone was present for exam.  Assessment: Adjustment disorder Grief reaction due to mother's death Stressors with new-ish married  Plan: Prozac .  Pt will take  daily for one week and then increase.  Nausea and typical side effects as well as serotonin syndrome discussed.  Pt will give update in about 10 days.  May need follow-up but will see how she is doing.    She will continue with her therapist and knows to call with any acute changes in her mood or outlook.  Promises to call if anything changes.  TSH with panel obtained.   ~25 minutes spent with patient >50% of time was in face to face discussion of above.

## 2015-09-17 ENCOUNTER — Encounter: Payer: Self-pay | Admitting: Obstetrics & Gynecology

## 2015-09-21 ENCOUNTER — Telehealth: Payer: Self-pay | Admitting: Obstetrics & Gynecology

## 2015-09-21 NOTE — Telephone Encounter (Signed)
Spoke with patient. Patient states that she was seen on 09/14/2015 with Dr.Miller and was started on Prozac. Would like Dr.Miller to know she is feeling much better. Had one day over the weekend with mild anxiety, but has greatly improved. She is now taking Prozac 20 mg daily. She will continue to take this dosage and return call to the office in 3 weeks to provide an update on how she is doing. She will contact the office sooner if she has any questions or concerns.  Routing to provider for final review. Patient agreeable to disposition. Will close encounter.

## 2015-09-21 NOTE — Telephone Encounter (Signed)
Patient called and left a message at lunch. She said, "I just need a nurse to call me back so I can give an update on my medication."

## 2015-10-23 DIAGNOSIS — S42009A Fracture of unspecified part of unspecified clavicle, initial encounter for closed fracture: Secondary | ICD-10-CM

## 2015-10-23 HISTORY — DX: Fracture of unspecified part of unspecified clavicle, initial encounter for closed fracture: S42.009A

## 2015-10-24 ENCOUNTER — Encounter (HOSPITAL_COMMUNITY): Payer: Self-pay

## 2015-10-24 ENCOUNTER — Emergency Department (HOSPITAL_COMMUNITY): Payer: 59

## 2015-10-24 ENCOUNTER — Emergency Department (HOSPITAL_COMMUNITY)
Admission: EM | Admit: 2015-10-24 | Discharge: 2015-10-24 | Disposition: A | Discharge status: Home / Self Care | Payer: 59 | Attending: Emergency Medicine | Admitting: Emergency Medicine

## 2015-10-24 DIAGNOSIS — Z87891 Personal history of nicotine dependence: Secondary | ICD-10-CM | POA: Insufficient documentation

## 2015-10-24 DIAGNOSIS — Z7982 Long term (current) use of aspirin: Secondary | ICD-10-CM | POA: Diagnosis not present

## 2015-10-24 DIAGNOSIS — S4991XA Unspecified injury of right shoulder and upper arm, initial encounter: Secondary | ICD-10-CM | POA: Diagnosis present

## 2015-10-24 DIAGNOSIS — Z79899 Other long term (current) drug therapy: Secondary | ICD-10-CM | POA: Insufficient documentation

## 2015-10-24 DIAGNOSIS — Y9301 Activity, walking, marching and hiking: Secondary | ICD-10-CM | POA: Insufficient documentation

## 2015-10-24 DIAGNOSIS — R51 Headache: Secondary | ICD-10-CM | POA: Insufficient documentation

## 2015-10-24 DIAGNOSIS — S42001A Fracture of unspecified part of right clavicle, initial encounter for closed fracture: Secondary | ICD-10-CM | POA: Diagnosis not present

## 2015-10-24 DIAGNOSIS — E039 Hypothyroidism, unspecified: Secondary | ICD-10-CM | POA: Insufficient documentation

## 2015-10-24 DIAGNOSIS — Y999 Unspecified external cause status: Secondary | ICD-10-CM | POA: Diagnosis not present

## 2015-10-24 DIAGNOSIS — W010XXA Fall on same level from slipping, tripping and stumbling without subsequent striking against object, initial encounter: Secondary | ICD-10-CM | POA: Insufficient documentation

## 2015-10-24 DIAGNOSIS — Y92002 Bathroom of unspecified non-institutional (private) residence single-family (private) house as the place of occurrence of the external cause: Secondary | ICD-10-CM | POA: Diagnosis not present

## 2015-10-24 DIAGNOSIS — R52 Pain, unspecified: Secondary | ICD-10-CM

## 2015-10-24 DIAGNOSIS — W19XXXA Unspecified fall, initial encounter: Secondary | ICD-10-CM

## 2015-10-24 MED ORDER — HYDROCODONE-ACETAMINOPHEN 5-325 MG PO TABS
1.0000 | ORAL_TABLET | Freq: Once | ORAL | Status: AC
  Administered 2015-10-24: 1 via ORAL
  Filled 2015-10-24: qty 1

## 2015-10-24 MED ORDER — HYDROCODONE-ACETAMINOPHEN 5-325 MG PO TABS: 1.0000 | ORAL_TABLET | ORAL | 0 refills | Status: DC | PRN

## 2015-10-24 NOTE — ED Provider Notes (Signed)
[facility]-EMERGENCY DEPT Provider Note   CSN: [id number] Arrival date & time: 10/24/15  4098  First Provider Contact:  First MD Initiated Contact with Patient 10/24/15 0809        History   Chief Complaint Chief Complaint  Patient presents with  . Fall    HPI Haley Buchanan is a 60 y.o. female.  Patient complains of right shoulder pain since fall last night. She does not recall details of the fall. Husband states she feels she walking to the bathroom and he heard her fall. She did not hit her head or lose consciousness. She missed to drinking alcohol last night. She thinks she slipped on the rug or tripped. Denies feeling dizzy or lightheaded. Denies any chest pain or shortness of breath. Denies any weakness in her arm. No numbness or tingling. No other issues with her shoulder. Denies any blood thinner use but does have a history of von Willebrand's disease. She took acetaminophen without relief.   The history is provided by the patient and the spouse.  Fall  Pertinent negatives include no chest pain, no abdominal pain, no headaches and no shortness of breath.    Past Medical History:  Diagnosis Date  . Abnormal glandular Papanicolaou smear of cervix 06/2005   followed by LEEP, + HR HPV DNA 06/2008  . Allergy   . Anemia 09/2004   iron deficient  . Depression    Last psychiatrist - Dr. Melrose Nakayama - does not have records as seen prior to 2007  . Foot fracture, left 11/2006   5th MTP  . Migraine   . Osteoporosis 02/2006   DEXA 04/23/12 w/ T score L femur -2.8 and L spine -1.4  . Thyroid disease 01/2007   subclinical hyperthyroidism  . Von Willebrand's disease [facility]) 03/2005    Patient Active Problem List   Diagnosis Date Noted  . ASCUS with positive high risk HPV 04/16/2015  . Von Willebrand's disease (HCC) 01/20/2015  . Urinary frequency 09/24/2012  . Osteoporosis 02/04/2012  . Headache, chronic daily 02/04/2012  . Atrophic vaginitis 07/14/2008  . Subclinical thyrotoxicosis  07/30/2007  . Headache 05/24/2006    Past Surgical History:  Procedure Laterality Date  . COLONOSCOPY N/A 10/15/2012   Procedure: COLONOSCOPY;  Surgeon: Charna Elizabeth, MD;  Location: [facility];  Service: Endoscopy;  Laterality: N/A;  . COLPORRHAPHY  01/27/15   Posterior- [facility], Dr. Gala Lewandowsky  . LEEP  2007  . TMJ ARTHROSCOPY    . TONSILLECTOMY AND ADENOIDECTOMY      OB History    Gravida Para Term Preterm AB Living   5 2 2   3 2    SAB TAB Ectopic Multiple Live Births   3               Home Medications    Prior to Admission medications   Medication Sig Start Date End Date Taking? Authorizing Provider  ALPRAZolam (XANAX) 0.25 MG tablet Take 1 tablet (0.25 mg total) by mouth at bedtime as needed for anxiety. 09/14/15  Yes Jerene Bears, MD  aspirin-acetaminophen-caffeine (EXCEDRIN MIGRAINE) [phone] MG tablet Take 1 tablet by mouth every 8 (eight) hours as needed for headache or migraine.    Yes Historical Provider, MD  B Complex Vitamins (VITAMIN-B COMPLEX) TABS Take 1 tablet by mouth daily.    Yes Historical Provider, MD  Calcium-Vitamin D 600-200 MG-UNIT tablet Take 2 tablets by mouth daily.    Yes Historical Provider, MD  Cholecalciferol (D 1000) 1000 UNITS capsule Take 5,000  Units by mouth daily.    Yes Historical Provider, MD  conjugated estrogens (PREMARIN) vaginal cream Apply pea-sized amount topically to urethra and inner labia daily as directed. 12/05/13  Yes Peyton Najjar, MD  DHEA 25 MG CAPS Take 1.5 mg by mouth daily.    Yes Historical Provider, MD  FLUoxetine (PROZAC) 20 MG tablet Take 1 tablet (20 mg total) by mouth daily. 09/14/15  Yes Jerene Bears, MD  glucosamine-chondroitin (GLUCOSAMINE-CHONDROITIN DS) 500-400 MG tablet Take 1 tablet by mouth 3 (three) times daily.    Yes Historical Provider, MD  Magnesium Oxide 400 MG CAPS Take 2 capsules by mouth daily.    Yes Historical Provider, MD  Multiple Vitamin tablet Take 1 tablet by mouth daily.  10/29/04  Yes  Historical Provider, MD  NON FORMULARY D [place]  1 a day   Yes Historical Provider, MD  Omega-3 1000 MG CAPS Take 1 g by mouth daily.    Yes Historical Provider, MD  Pregnenolone POWD 1.5 mg by Does not apply route.   Yes Historical Provider, MD  Probiotic Product (ULTRAFLORA IMMUNE HEALTH) 170 MG CAPS Take 1 capsule by mouth.   Yes Historical Provider, MD  Progesterone Micronized (PROGESTERONE PO) Take 2.5 capsules by mouth daily.   Yes Historical Provider, MD  risedronate (ACTONEL) 35 MG tablet Take 1 tablet (35 mg total) by mouth every 7 (seven) days. with water on empty stomach, nothing by mouth or lie down for next 30 minutes. 06/01/15  Yes Brook E Ardell Isaacs, MD  UNABLE TO FIND Argentyn   Yes Historical Provider, MD  UNABLE TO FIND Similase   Yes Historical Provider, MD  UNABLE TO FIND UltraHepatrope   Yes Historical Provider, MD  valACYclovir (VALTREX) 500 MG tablet Take 500 mg by mouth as needed.   Yes Historical Provider, MD  HYDROcodone-acetaminophen (NORCO/VICODIN) 5-325 MG tablet Take 1 tablet by mouth every 4 (four) hours as needed. 10/24/15   Glynn Octave, MD    Family History Family History  Problem Relation Age of Onset  . Osteoporosis Mother   . Heart Problems Mother   . Thyroid disease Mother   . Cancer Father   . Ulcers Father   . Alcohol abuse Father   . Osteoporosis Maternal Grandmother   . Alzheimer's disease Maternal Grandfather   . Depression Daughter   . Psychiatric Illness Brother     Social History Social History  Substance Use Topics  . Smoking status: Former Smoker    Packs/day: 1.00    Years: 2.00    Types: Cigarettes    Quit date: 03/29/1999  . Smokeless tobacco: Never Used  . Alcohol use 1.2 oz/week    2 Glasses of wine per week     Allergies   Macrobid [nitrofurantoin]   Review of Systems Review of Systems  Constitutional: Negative for activity change and fever.  Respiratory: Negative for cough, chest tightness and shortness of  breath.   Cardiovascular: Negative for chest pain.  Gastrointestinal: Negative for abdominal pain, nausea and vomiting.  Genitourinary: Negative for dysuria and hematuria.  Musculoskeletal: Positive for arthralgias and myalgias. Negative for back pain and neck pain.  Skin: Negative for wound.  Neurological: Negative for dizziness, weakness, numbness and headaches.   A complete 10 system review of systems was obtained and all systems are negative except as noted in the HPI and PMH.    Physical Exam Updated Vital Signs BP 98/65 (BP Location: Left Arm)   Pulse 92  Temp 98 F (36.7 C) (Oral)   Resp 18   Ht  (1.626 m)   Wt 168 lb (76.2 kg)   LMP 02/17/2006   SpO2 97%   BMI 28.84 kg/m   Physical Exam  Constitutional: She is oriented to person, place, and time. She appears well-developed and well-nourished. No distress.  HENT:  Head: Normocephalic and atraumatic.  Mouth/Throat: Oropharynx is clear and moist. No oropharyngeal exudate.  Eyes: Conjunctivae and EOM are normal. Pupils are equal, round, and reactive to light.  Neck: Normal range of motion. Neck supple.  No C spine tenderness  Cardiovascular: Normal rate, regular rhythm, normal heart sounds and intact distal pulses.   No murmur heard. Pulmonary/Chest: Effort normal and breath sounds normal. No respiratory distress. She exhibits no tenderness.  Abdominal: Soft. There is no tenderness. There is no rebound and no guarding.  Musculoskeletal: Normal range of motion. She exhibits tenderness. She exhibits no edema or deformity.  TTP R anterior and lateral shoulder. No deformity. Intact radial pulse, intact axillary nerve sensation, intact cardinal hand movements.  Neurological: She is alert and oriented to person, place, and time. No cranial nerve deficit. She exhibits normal muscle tone. Coordination normal.  No ataxia on finger to nose bilaterally. No pronator drift. 5/5 strength throughout. CN 2-12 intact.Equal grip  strength. Sensation intact.   Skin: Skin is warm.  Psychiatric: She has a normal mood and affect. Her behavior is normal.  Nursing note and vitals reviewed.    ED Treatments / Results  Labs (all labs ordered are listed, but only abnormal results are displayed) Labs Reviewed - No data to display  EKG  EKG Interpretation  Date/Time:  Saturday October 24 2015 09:18:14 EDT Ventricular Rate:  70 PR Interval:    QRS Duration: 95 QT Interval:  396 QTC Calculation: 428 R Axis:   78 Text Interpretation:  Sinus rhythm Anteroseptal infarct, old Baseline wander in lead(s) III aVL Artifact No previous ECGs available Confirmed by Manus Gunning  MD, Danney Bungert [phone]) on 10/24/2015 9:49:39 AM       Radiology Dg Clavicle Right  Result Date: 10/24/2015 CLINICAL DATA:  Fall last night.  Right shoulder pain. EXAM: RIGHT CLAVICLE - 2+ VIEWS COMPARISON:  Right shoulder series performed today. FINDINGS: There is a comminuted, mildly displaced fracture through the distal right clavicle. AC joint is intact. Glenohumeral joint is maintained. IMPRESSION: Mildly comminuted and displaced distal right clavicle fracture. Electronically Signed   By: Charlett Nose M.D.   On: 10/24/2015 08:58  Dg Shoulder Right  Result Date: 10/24/2015 CLINICAL DATA:  Fall last night.  Pain. EXAM: RIGHT SHOULDER - 2+ VIEW COMPARISON:  None. FINDINGS: There is a comminuted, mildly displaced fracture through the distal right clavicle. AC joint appears intact. Glenohumeral joint maintained. IMPRESSION: Comminuted, mildly displaced distal right clavicle fracture. Electronically Signed   By: Charlett Nose M.D.   On: 10/24/2015 08:58  Ct Head Wo Contrast  Result Date: 10/24/2015 CLINICAL DATA:  60 year old female with fall last night and possible loss of consciousness. EXAM: CT HEAD WITHOUT CONTRAST TECHNIQUE: Contiguous axial images were obtained from the base of the skull through the vertex without intravenous contrast. COMPARISON:  None.  FINDINGS: Mild generalized cerebral volume loss noted. No acute intracranial abnormalities are identified, including mass lesion or mass effect, hydrocephalus, extra-axial fluid collection, midline shift, hemorrhage, or acute infarction. The visualized bony calvarium is unremarkable. IMPRESSION: No evidence of acute intracranial abnormality. Mild generalized cerebral volume loss. Electronically Signed   By: Tinnie Gens  Hu M.D.   On: 10/24/2015 09:10   Procedures Procedures (including critical care time)  Medications Ordered in ED Medications  HYDROcodone-acetaminophen (NORCO/VICODIN) 5-325 MG per tablet 1 tablet (1 tablet Oral Given 10/24/15 0831)     Initial Impression / Assessment and Plan / ED Course  I have reviewed the triage vital signs and the nursing notes.  Pertinent labs & imaging results that were available during my care of the patient were reviewed by me and considered in my medical decision making (see chart for details).  Clinical Course  Fall last night with R shoulder pain. Does not recall details.admits to drinking last night. Nonfocal neurological exam.  CT head obtained given her history of von Willebrand's and unable to recall details of fall. CT head negative. X-ray shows right clavicle fracture.  Patient placed in sling, pain control, follow-up with orthopedics. Return precautions discussed.  Final Clinical Impressions(s) / ED Diagnoses   Final diagnoses:  Fall, initial encounter  Clavicle fracture, right, closed, initial encounter    New Prescriptions New Prescriptions   HYDROCODONE-ACETAMINOPHEN (NORCO/VICODIN) 5-325 MG TABLET    Take 1 tablet by mouth every 4 (four) hours as needed.     Glynn Octave, MD 10/24/15 [phone]

## 2015-10-24 NOTE — ED Triage Notes (Signed)
Patient fell last pm after slipping on rug in bathroom landing on right shoulder, denies loc but remembers nothing related to fall. On arrival complains of right shoulder pain only. States pain with any movement

## 2015-10-24 NOTE — ED Notes (Signed)
Applied lt. Shoulder sling

## 2015-10-24 NOTE — Progress Notes (Signed)
Orthopedic Tech Progress Note Patient Details:  Haley Buchanan [birth year] [id number]  Ortho Devices Type of Ortho Device: Arm sling   Saul Fordyce 10/24/2015, 11:10 AM

## 2015-11-26 IMAGING — CR DG ANKLE COMPLETE 3+V*L*
4 series · 4 of 4 positions shown · non-contrast
Comparison: None.

CLINICAL DATA: Pain

EXAM:
LEFT ANKLE COMPLETE - 3+ VIEW

[AP]
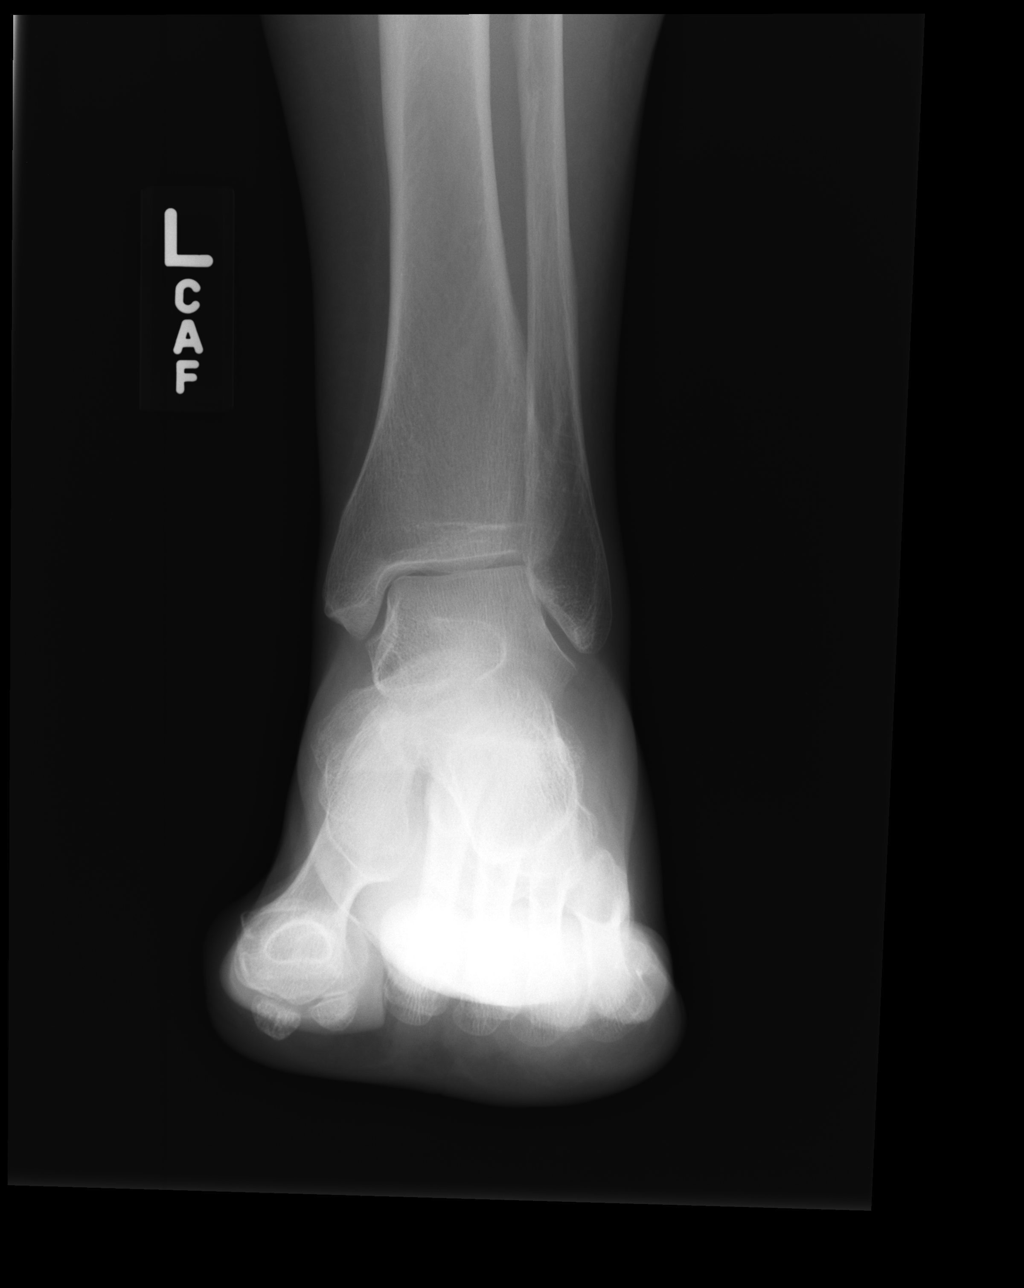

[ap obl int rot]
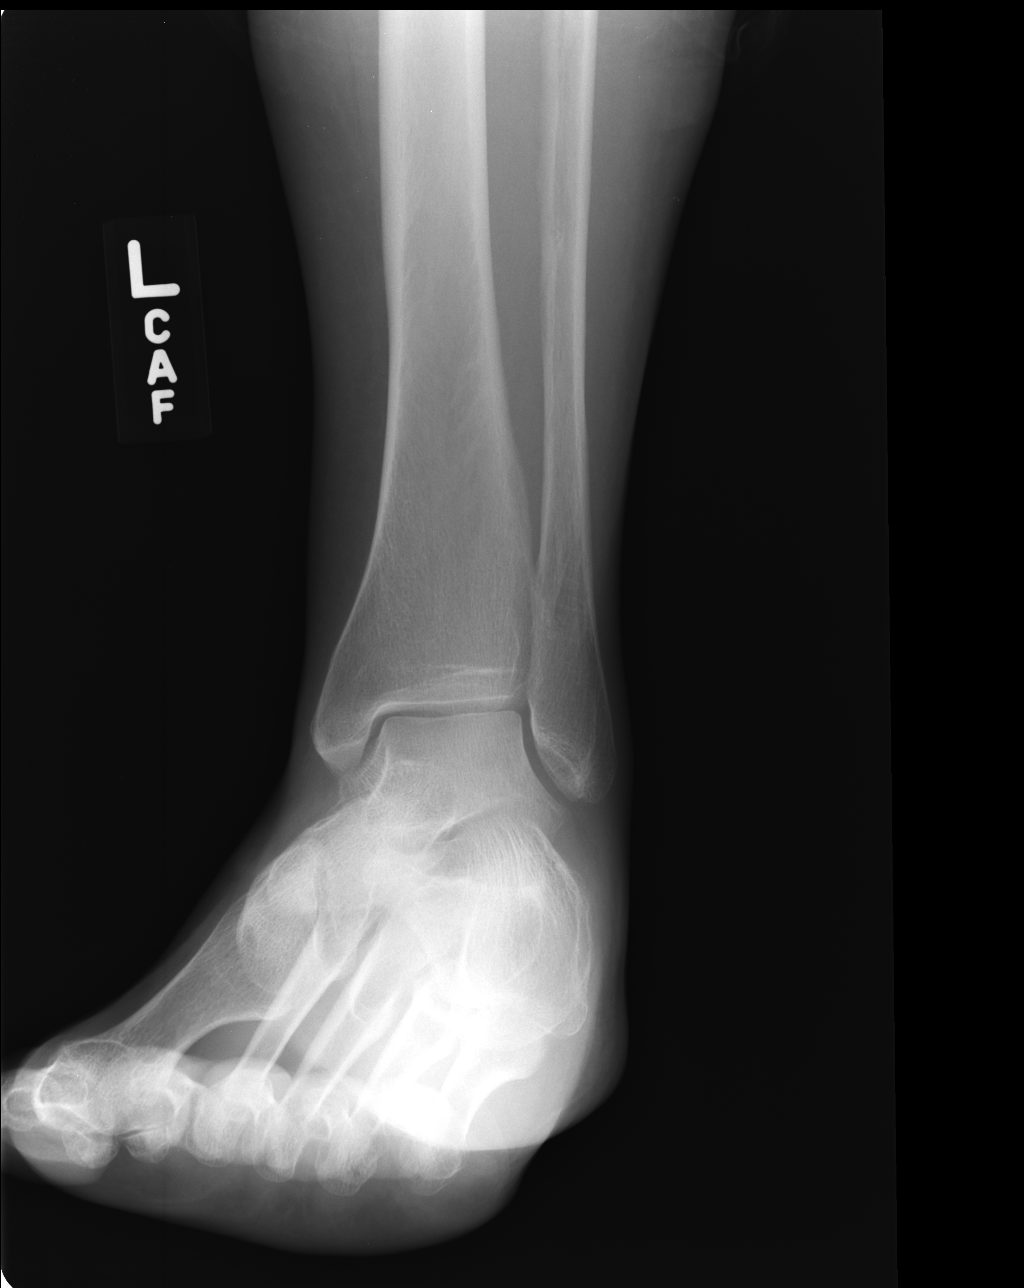

[ap obl ext rot]
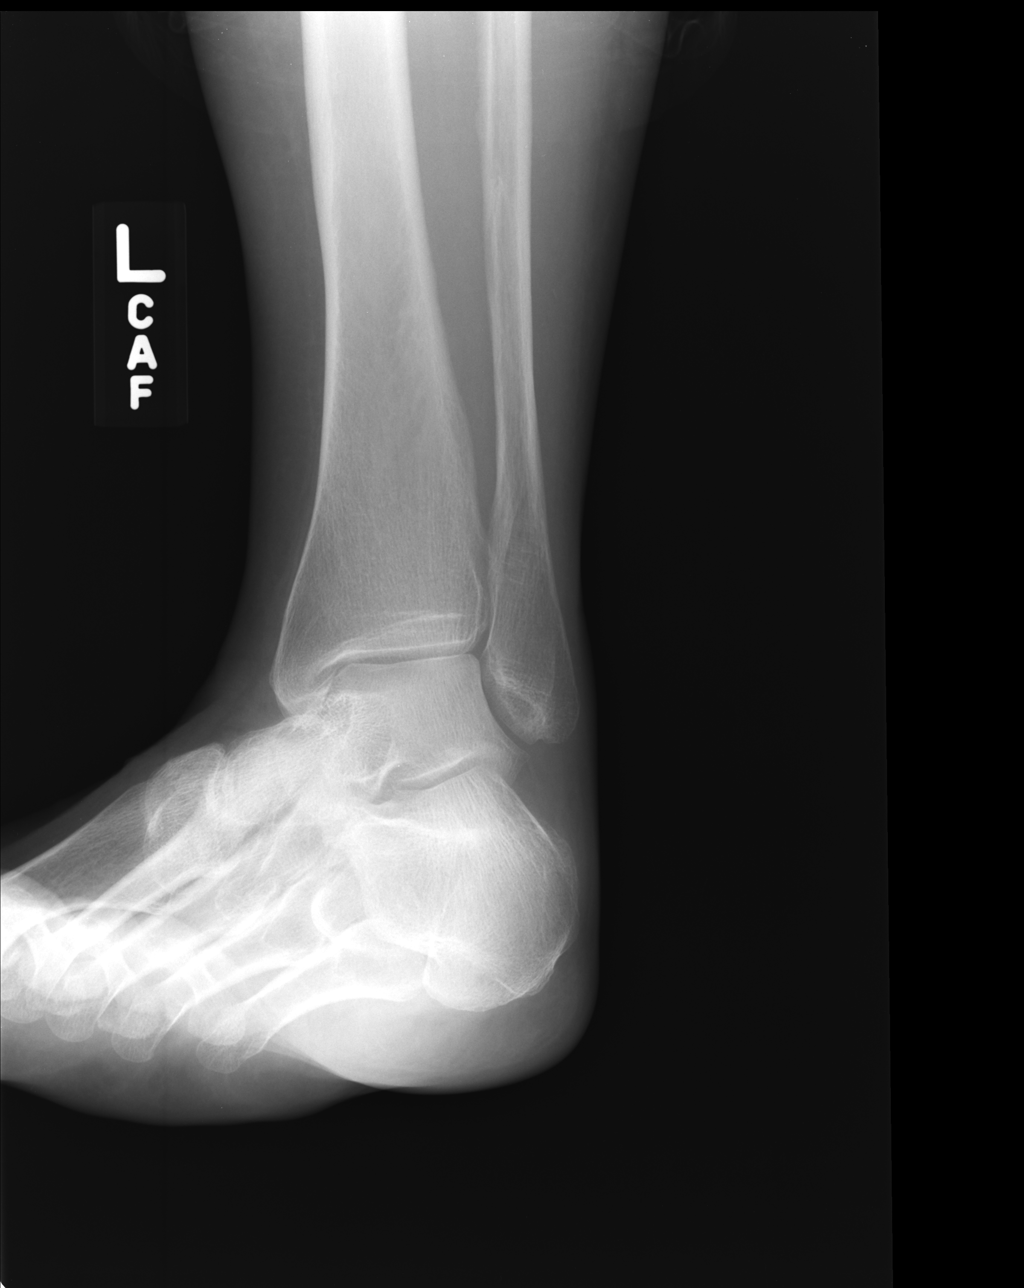

[lateral]
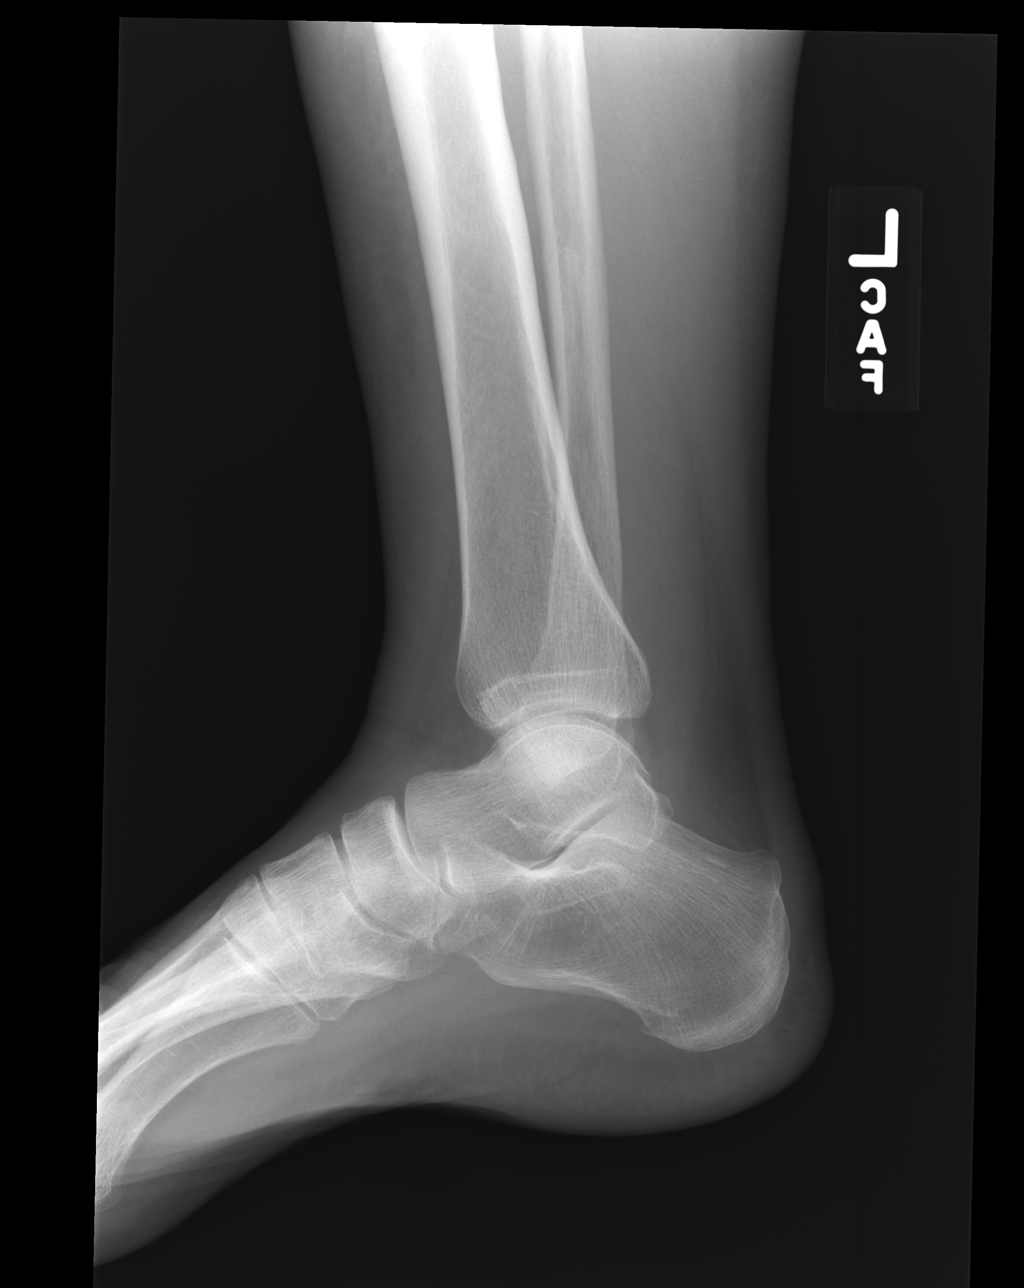

[4 of 4 positions shown; findings below may reference images not displayed]

FINDINGS: There is no evidence of fracture, dislocation, or joint effusion.
There is no evidence of arthropathy or other focal bone abnormality.
Soft tissues are unremarkable.
IMPRESSION: Negative.

## 2015-12-08 ENCOUNTER — Telehealth: Payer: Self-pay | Admitting: Obstetrics & Gynecology

## 2015-12-08 DIAGNOSIS — F329 Major depressive disorder, single episode, unspecified: Secondary | ICD-10-CM

## 2015-12-08 DIAGNOSIS — F32A Depression, unspecified: Secondary | ICD-10-CM

## 2015-12-08 NOTE — Telephone Encounter (Signed)
Spoke with patient. Patient states that she has been taking Prozac 20 mg daily since June. States that she is feeling less depressed with taking Prozac, but is still having days that are hard. Reports she has a lot going on with her family and feels this may be causing it. Asking if Dr.Miller could increase her dosage of Prozac to see if this would help. Advised I will speak with Dr.Miller and return call with further recommendations. She is agreeable and verbalizes understanding.

## 2015-12-08 NOTE — Telephone Encounter (Signed)
Patient returned call

## 2015-12-08 NOTE — Telephone Encounter (Signed)
Patient has a question about the FLUoxetine (PROZAC) 20 MG tablet that she has been taking.

## 2015-12-08 NOTE — Telephone Encounter (Signed)
Return call to patient, left message to call back and ask for triage nurse.

## 2015-12-09 ENCOUNTER — Other Ambulatory Visit: Payer: Self-pay

## 2015-12-09 MED ORDER — FLUOXETINE HCL 40 MG PO CAPS: 40.0000 mg | ORAL_CAPSULE | Freq: Every day | ORAL | 0 refills | Status: DC

## 2015-12-09 NOTE — Telephone Encounter (Signed)
Ok to increase to 40mg  daily.  See if she needs follow up in one month, as well.  Thanks.

## 2015-12-09 NOTE — Telephone Encounter (Signed)
Left message (per DPR) for patient to return my call to schedule a 1 month follow up of Fluoxetine 40mg . Advised patient that a new prescription of Fluoxetine 40mg  was sent into her pharmacy today.

## 2015-12-09 NOTE — Telephone Encounter (Signed)
Patient scheduled for 12/18/15 with Dr. Hyacinth MeekerMiller.

## 2015-12-10 NOTE — Telephone Encounter (Signed)
Message noted about scheduled follow up and prescription for Prozac 40mg  being sent to pharmacy.   Routing to provider for final review. Will close encounter.

## 2015-12-18 ENCOUNTER — Encounter: Payer: Self-pay | Admitting: Obstetrics & Gynecology

## 2015-12-18 ENCOUNTER — Ambulatory Visit (INDEPENDENT_AMBULATORY_CARE_PROVIDER_SITE_OTHER): Payer: 59 | Admitting: Obstetrics & Gynecology

## 2015-12-18 VITALS — BP 120/76 | HR 82 | Resp 14 | Ht 64.5 in | Wt 167.0 lb

## 2015-12-18 DIAGNOSIS — F4321 Adjustment disorder with depressed mood: Secondary | ICD-10-CM | POA: Diagnosis not present

## 2015-12-18 NOTE — Progress Notes (Signed)
GYNECOLOGY  VISIT   HPI: 60 y.o. [id number] Divorced Caucasian female here for follow up of depression.  Has increased to 40mg  just the last four or five days.  Reports "emotions" are better and crying is better.  Reports she feels there is still improvement that she would benefit from having.  Denies suicidal thoughts.  Her mother passed in May.  Her daughter is going through a divorce right now.  She does not have any children.  She is 60.  This was hard for pt because this all started at her own recent wedding.  Pt's daughter told her all of what was going on in her own wedding when pt was getting married. Doesn't feel "responsible" but timing has made her own marriage harder.  Pt reports her new marriage is very good.  They are in a rental apartment and feel they need a new and larger home.  They are actively looking for a larger home.  Husband works in the [employer] in [place] and waste management.  He is 57.  Reports they were prayer partners for five years.  Pt has been on Strattera, Cymbalta, and Topamax in the past.    She and spouse continue to have issues with their schedules.  She works 5 days a week.  He works four days on and three days off.  Pt reports she has use a planner for a long time and he resists this so it's "hard to get on the same page".  Pt states several times that "we have a good marriage".  Reports she just doesn't feel as connected as she had hoped at this point in the marriage.    She is seeing a therapist, Marthenia Rollingonya Thompson, through her work.  She has been seen 9 times.  Reports it is a good relationship.  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    Pt has book about thyroid disorder being root of many physiological issues.  Pt has TSH and panel done recently.  Advised pt she does NOT have a thyroid disorder and she should NOT be on thyroid medication.  Even if her integrative physician recommends this so her, I do not.  Pt is clear in my  recommendations.  She is also aware I do not endorse a lot of what is in the book she has as I have read a fair amount of it.  GYNECOLOGIC HISTORY: Patient's last menstrual period was 02/17/2006.   Patient Active Problem List   Diagnosis Date Noted  . ASCUS with positive high risk HPV 04/16/2015  . Von Willebrand's disease (HCC) 01/20/2015  . Urinary frequency 09/24/2012  . Osteoporosis 02/04/2012  . Headache, chronic daily 02/04/2012  . Atrophic vaginitis 07/14/2008  . Subclinical thyrotoxicosis 07/30/2007  . Headache 05/24/2006    Past Medical History:  Diagnosis Date  . Abnormal glandular Papanicolaou smear of cervix 06/2005   followed by LEEP, + HR HPV DNA 06/2008  . Allergy   . Anemia 09/2004   iron deficient  . Broken clavicle 10/23/2015   pt states she fell in bathroom   . Depression    Last psychiatrist - Dr. Melrose NakayamaPascal - does not have records as seen prior to 2007  . Foot fracture, left 11/2006   5th MTP  . Migraine   . Osteoporosis 02/2006   DEXA 04/23/12 w/ T score L femur -2.8 and L spine -1.4  . Thyroid disease 01/2007   subclinical hyperthyroidism  .  Von Willebrand's disease (HCC) 03/2005    Past Surgical History:  Procedure Laterality Date  . COLONOSCOPY N/A 10/15/2012   Procedure: COLONOSCOPY;  Surgeon: Charna Elizabeth, MD;  Location: [facility];  Service: Endoscopy;  Laterality: N/A;  . COLPORRHAPHY  01/27/15   Posterior- [facility], Dr. Gala Lewandowsky  . LEEP  2007  . TMJ ARTHROSCOPY    . TONSILLECTOMY AND ADENOIDECTOMY      MEDS:  Reviewed in EPIC and UTD  ALLERGIES: Macrobid [nitrofurantoin]  Family History  Problem Relation Age of Onset  . Osteoporosis Mother   . Heart Problems Mother   . Thyroid disease Mother   . Cancer Father   . Ulcers Father   . Alcohol abuse Father   . Osteoporosis Maternal Grandmother   . Alzheimer's disease Maternal Grandfather   . Depression Daughter   . Psychiatric Illness Brother     SH:  Married, non  smoker  ROS  PHYSICAL EXAMINATION:    BP 120/76 (BP Location: Right Arm, Patient Position: Sitting, Cuff Size: Normal)   Pulse 82   Resp 14   Ht 5' 4.5" (1.638 m)   Wt 167 lb (75.8 kg)   LMP 02/17/2006   BMI 28.22 kg/m     General appearance: alert, cooperative and appears stated age Mood:  Improved, no crying today.  Assessment: Adjustment disorder with depressed mood  Plan: Continue therapy session Continue prozac 40mg .  Pt to give update in 3-4 more weeks.  Pt may benefit from psychiatrist.  Will refer if this does not help enough.   ~30 minutes spent with patient today, all in face to face discussion.

## 2016-01-19 ENCOUNTER — Telehealth: Payer: Self-pay | Admitting: Obstetrics & Gynecology

## 2016-01-19 MED ORDER — FLUOXETINE HCL 40 MG PO CAPS: 40.0000 mg | ORAL_CAPSULE | Freq: Every day | ORAL | 2 refills | Status: DC

## 2016-01-19 NOTE — Telephone Encounter (Signed)
Yes.  Ok to RF through [facility].

## 2016-01-19 NOTE — Telephone Encounter (Signed)
Patient stated she was to call Dr Hyacinth MeekerMiller and let the doctor know how she is feeling on the new medication. Patient states, "I'm feeling fine".  Patient advises she only has 2 pills left and is requesting a refill on the fluoxetine.  Advised patient I will forward this information to our clinical staff. Patient is agreeable.  Routing to Triage

## 2016-01-19 NOTE — Telephone Encounter (Signed)
Patient was last seen in the office on 12/18/2015 with Dr.Miller. Patient has continued on Fluoxetine 40 mg daily and reports she is doing well. Requesting a refill of Fluoxetine 40 mg daily at this time.  Dr.Miller, okay to refill Fluoxetine until patient's next aex at this time?

## 2016-01-19 NOTE — Telephone Encounter (Signed)
Left message to call Trevaun Rendleman at [phone].  Rx for Fluoxetine 40 mg take 1 tablet daily #30 2RF until aex on 03/17/2016 sent to pharmacy on file.

## 2016-01-20 NOTE — Telephone Encounter (Signed)
Spoke with patient. Advised refills of Fluoxetine 40 mg has been sent to her pharmacy on file until her aex in December. Patient is agreeable.  Routing to provider for final review. Patient agreeable to disposition. Will close encounter.

## 2016-03-17 ENCOUNTER — Encounter: Payer: Self-pay | Admitting: Obstetrics & Gynecology

## 2016-03-17 ENCOUNTER — Ambulatory Visit (INDEPENDENT_AMBULATORY_CARE_PROVIDER_SITE_OTHER): Payer: 59 | Admitting: Obstetrics & Gynecology

## 2016-03-17 VITALS — BP 116/74 | HR 80 | Resp 14 | Ht 64.0 in | Wt 165.0 lb

## 2016-03-17 DIAGNOSIS — Z01419 Encounter for gynecological examination (general) (routine) without abnormal findings: Secondary | ICD-10-CM

## 2016-03-17 DIAGNOSIS — B977 Papillomavirus as the cause of diseases classified elsewhere: Secondary | ICD-10-CM | POA: Diagnosis not present

## 2016-03-17 DIAGNOSIS — Z205 Contact with and (suspected) exposure to viral hepatitis: Secondary | ICD-10-CM

## 2016-03-17 DIAGNOSIS — Z124 Encounter for screening for malignant neoplasm of cervix: Secondary | ICD-10-CM

## 2016-03-17 MED ORDER — FLUOXETINE HCL 40 MG PO CAPS
40.0000 mg | ORAL_CAPSULE | Freq: Every day | ORAL | 4 refills | Status: DC
Start: 2016-03-17 — End: 2017-01-06

## 2016-03-17 MED ORDER — ALPRAZOLAM 0.25 MG PO TABS: 0.2500 mg | ORAL_TABLET | Freq: Every evening | ORAL | 0 refills | Status: DC | PRN

## 2016-03-17 NOTE — Patient Instructions (Addendum)
[facility] [phone]  Adelene AmasLinda Makinson, PhD  [facility] [address], [address], [place], [place] [address] P: [phone]

## 2016-03-17 NOTE — Progress Notes (Signed)
60 y.o. [id number] DivorcedCaucasianF here for annual exam.  Reports she thinks she is coming down with a cold--feeling some fatigue and has a sore throat today.  Feels like she is having worse issues with her depression.  Not sure if this is holiday related.  Pt still reports issues with her new marriage.  He has changed to days so his schedule is much more predictable.  They've been married a year and two months.  He had a hemorrhoidectomy in early December.  She has seen counselor through work but would like some other names, specifically if the person is a Librarian, academicChristian counselor.  Some information given to patient.  Patient's last menstrual period was 02/17/2006.          Sexually active: Yes.    The current method of family planning is post menopausal status.    Exercising: No.  The patient does not participate in regular exercise at present. Smoker:  Former smoker  Health Maintenance: Pap:  03/17/15 ASCUS, +HR HPV, 04/14/15 colposcopy showed grade 1 dysplasia History of abnormal Pap:  yes MMG: 04/28/15 BIRADS 1 negative  Colonoscopy:  10/15/12 normal- repeat in 7 years BMD:   04/28/15 osteoporosis in hip  TDaP:  03/17/15  Pneumonia vaccine(s):  never Zostavax:   never Hep C testing: declined Screening Labs: PCP, Hb today: PCP, Urine today: PCP   reports that she quit smoking about 16 years ago. Her smoking use included Cigarettes. She has a 2.00 pack-year smoking history. She has never used smokeless tobacco. She reports that she drinks about 1.2 oz of alcohol per week . She reports that she does not use drugs.  Past Medical History:  Diagnosis Date  . Abnormal glandular Papanicolaou smear of cervix 06/2005   followed by LEEP, + HR HPV DNA 06/2008  . Allergy   . Anemia 09/2004   iron deficient  . Broken clavicle 10/23/2015   pt states she fell in bathroom   . Depression    Last psychiatrist - Dr. Melrose NakayamaPascal - does not have records as seen prior to 2007  . Foot fracture, left 11/2006   5th MTP  . Migraine   . Osteoporosis 02/2006   DEXA 04/23/12 w/ T score L femur -2.8 and L spine -1.4  . Thyroid disease 01/2007   subclinical hyperthyroidism  . Von Willebrand's disease (HCC) 03/2005    Past Surgical History:  Procedure Laterality Date  . COLONOSCOPY N/A 10/15/2012   Procedure: COLONOSCOPY;  Surgeon: Charna ElizabethJyothi Mann, MD;  Location: [facility];  Service: Endoscopy;  Laterality: N/A;  . COLPORRHAPHY  01/27/15   Posterior- [facility], Dr. Gala LewandowskyBadlani  . LEEP  2007  . TMJ ARTHROSCOPY    . TONSILLECTOMY AND ADENOIDECTOMY      Current Outpatient Prescriptions  Medication Sig Dispense Refill  . ALPRAZolam (XANAX) 0.25 MG tablet Take 1 tablet (0.25 mg total) by mouth at bedtime as needed for anxiety. 20 tablet 0  . aspirin-acetaminophen-caffeine (EXCEDRIN MIGRAINE) 250-250-65 MG tablet Take 1 tablet by mouth every 8 (eight) hours as needed for headache or migraine.     . B Complex Vitamins (VITAMIN-B COMPLEX) TABS Take 1 tablet by mouth daily.     . Calcium-Vitamin D 600-200 MG-UNIT tablet Take 2 tablets by mouth daily.     . Cholecalciferol (D 1000) 1000 UNITS capsule Take 5,000 Units by mouth daily.     Marland Kitchen. conjugated estrogens (PREMARIN) vaginal cream Apply pea-sized amount topically to urethra and inner labia daily as directed. 42.5 g 2  .  FLUoxetine (PROZAC) 40 MG capsule Take 1 capsule (40 mg total) by mouth daily. 30 capsule 2  . glucosamine-chondroitin (GLUCOSAMINE-CHONDROITIN DS) 500-400 MG tablet Take 1 tablet by mouth 3 (three) times daily.     Marland Kitchen HYDROcodone-acetaminophen (NORCO/VICODIN) 5-325 MG tablet Take 1 tablet by mouth every 4 (four) hours as needed. 10 tablet 0  . hydrocortisone-pramoxine (ANALPRAM-HC) 2.5-1 % rectal cream Place rectally.    . Magnesium Oxide 400 MG CAPS Take 2 capsules by mouth daily.     . Multiple Vitamin tablet Take 1 tablet by mouth daily.     . NON FORMULARY D Mannose  1 a day    . Omega-3 1000 MG CAPS Take 1 g by mouth daily.     .  Probiotic Product (ULTRAFLORA IMMUNE HEALTH) 170 MG CAPS Take 1 capsule by mouth.    . risedronate (ACTONEL) 35 MG tablet Take 1 tablet (35 mg total) by mouth every 7 (seven) days. with water on empty stomach, nothing by mouth or lie down for next 30 minutes. 4 tablet 9  . UNABLE TO FIND Argentyn    . UNABLE TO FIND Similase    . UNABLE TO FIND UltraHepatrope    . valACYclovir (VALTREX) 500 MG tablet Take 500 mg by mouth as needed.    Marland Kitchen amoxicillin (AMOXIL) 500 MG capsule Take 500 mg by mouth 3 (three) times daily.  0   No current facility-administered medications for this visit.     Family History  Problem Relation Age of Onset  . Osteoporosis Mother   . Heart Problems Mother   . Thyroid disease Mother   . Cancer Father   . Ulcers Father   . Alcohol abuse Father   . Osteoporosis Maternal Grandmother   . Alzheimer's disease Maternal Grandfather   . Depression Daughter   . Psychiatric Illness Brother     ROS:  Pertinent items are noted in HPI.  Otherwise, a comprehensive ROS was negative.  Exam:   BP 116/74 (BP Location: Right Arm, Patient Position: Sitting, Cuff Size: Normal)   Pulse 80   Resp 14   Ht 5\' 4"  (1.626 m)   Wt 165 lb (74.8 kg)   LMP 02/17/2006   BMI 28.32 kg/m   Weight change: @WEIGHTCHANGE @ Height:   Height: 5\' 4"  (162.6 cm)  Ht Readings from Last 3 Encounters:  03/17/16 5\' 4"  (1.626 m)  12/18/15 5' 4.5" (1.638 m)  10/24/15 5\' 4"  (1.626 m)    General appearance: alert, cooperative and appears stated age Head: Normocephalic, without obvious abnormality, atraumatic Neck: no adenopathy, supple, symmetrical, trachea midline and thyroid normal to inspection and palpation Lungs: clear to auscultation bilaterally Breasts: normal appearance, no masses or tenderness Heart: regular rate and rhythm Abdomen: soft, non-tender; bowel sounds normal; no masses,  no organomegaly Extremities: extremities normal, atraumatic, no cyanosis or edema Skin: Skin color, texture,  turgor normal. No rashes or lesions Lymph nodes: Cervical, supraclavicular, and axillary nodes normal. No abnormal inguinal nodes palpated Neurologic: Grossly normal   Pelvic: External genitalia:  no lesions              Urethra:  normal appearing urethra with no masses, tenderness or lesions              Bartholins and Skenes: normal                 Vagina: normal appearing vagina with normal color and discharge, no lesions  Cervix: no lesions              Pap taken: Yes.   Bimanual Exam:  Uterus:  normal size, contour, position, consistency, mobility, non-tender              Adnexa: normal adnexa and no mass, fullness, tenderness               Rectovaginal: Confirms               Anus:  normal sphincter tone, no lesions  Chaperone was present for exam.  A:  Well Woman with normal exam PMP, no HRT Atrophic vaginitis H/o recurrent UTI that seems to be much improved Fecal incontinence due to poor rectal tone.  Not interested in treatment Depression History of Von Willeband's disease.  [facility] Osteoporosis.  Followed by Dr. Abigail Miyamotohacker.  On Actonel History of LEEP for abnormal pap 2007.  Neg pap with +HR HPV, neg 16/18/45 2015.  Pap ASCUS with+HR HPV 2016 H/O HSV  P:  Mammogram yearly. Pap smear and HR HPV today.  Pt uses Valtrex prn.  No Rx needed. Rx for Prozac 40mg  daily.  #90/4RF.  Highly encouraged pt to be seen by therapist with more regular basis.  She has several things in her past that would likely help her depression with more regular counseling.  Names given.

## 2016-03-18 LAB — PAP IG AND HPV HIGH-RISK: HPV DNA High Risk: DETECTED — AB

## 2016-03-18 LAB — HEPATITIS C ANTIBODY: HCV AB: NEGATIVE

## 2016-03-24 ENCOUNTER — Telehealth: Payer: Self-pay

## 2016-03-24 ENCOUNTER — Other Ambulatory Visit: Payer: Self-pay | Admitting: Family Medicine

## 2016-03-24 DIAGNOSIS — R87613 High grade squamous intraepithelial lesion on cytologic smear of cervix (HGSIL): Secondary | ICD-10-CM

## 2016-03-24 DIAGNOSIS — Z1231 Encounter for screening mammogram for malignant neoplasm of breast: Secondary | ICD-10-CM

## 2016-03-24 DIAGNOSIS — R87612 Low grade squamous intraepithelial lesion on cytologic smear of cervix (LGSIL): Secondary | ICD-10-CM

## 2016-03-24 NOTE — Telephone Encounter (Signed)
-----   Message from Jerene BearsMary S Miller, MD sent at 03/18/2016  5:19 PM EST ----- Please let pt know her pap showed LGSIL with a few HGSIL cells.  She needs a colposcopy.  Also, her Hep C testing was negative.

## 2016-03-24 NOTE — Telephone Encounter (Signed)
Spoke with patient. Advised of message and results as seen below from Dr.Miller. Patient is agreeable and verbalizes understanding. Appointment for colposcopy scheduled for 04/11/2016 at 3:30 pm with Dr.Miller. Patient is agreeable to date and time.  Instructions given. Motrin 800 mg po x , one hour before appointment with food. Make sure to eat a meal before appointment and drink plenty of fluids. Patient agreeable and verbalized understanding of all instructions. Order placed for precert.  Routing to provider for final review. Patient agreeable to disposition. Will close encounter.

## 2016-04-11 ENCOUNTER — Ambulatory Visit (INDEPENDENT_AMBULATORY_CARE_PROVIDER_SITE_OTHER): Payer: 59 | Admitting: Obstetrics & Gynecology

## 2016-04-11 ENCOUNTER — Encounter: Payer: Self-pay | Admitting: Obstetrics & Gynecology

## 2016-04-11 DIAGNOSIS — R87612 Low grade squamous intraepithelial lesion on cytologic smear of cervix (LGSIL): Secondary | ICD-10-CM

## 2016-04-11 DIAGNOSIS — R87613 High grade squamous intraepithelial lesion on cytologic smear of cervix (HGSIL): Secondary | ICD-10-CM

## 2016-04-11 NOTE — Patient Instructions (Signed)

## 2016-04-11 NOTE — Progress Notes (Signed)
61 y.o. [id number] Divorced (remairred) Caucasian female here for colposcopy due the LGSIL pap with possible high grade findings.  Pt has hx of +HR HPV as well.      Prior evaluation/treatment:  colposcopy 04/14/15 due to ASCUS with +HR HPV pap and 05/20/14 due to +HR HPV testing.  Patient's last menstrual period was 02/17/2006.          Sexually active: Yes.    The current method of family planning is post menopausal status.     Patient has been counseled about results and procedure.  Risks and benefits have bene reviewed including immediate and/or delayed bleeding, infection, cervical scaring from procedure, possibility of needing additional follow up as well as treatment.  rare risks of missing a lesion discussed as well.  All questions answered.  Pt ready to proceed.  BP 118/74 (BP Location: Right Arm, Patient Position: Sitting, Cuff Size: Normal)   Pulse 88   Resp 18   Ht 5' 4.25" (1.632 m)   Wt 162 lb (73.5 kg)   LMP 02/17/2006   BMI 27.59 kg/m   Physical Exam  Constitutional: She appears well-developed and well-nourished.  Genitourinary: Vagina normal. There is no rash, tenderness, lesion or injury on the right labia. There is no rash, tenderness, lesion or injury on the left labia.  Lymphadenopathy:       Right: No inguinal adenopathy present.       Left: No inguinal adenopathy present.  Skin: Skin is warm and dry.  Psychiatric: She has a normal mood and affect.   Speculum placed.  3% acetic acid applied to cervix for >45 seconds.  Cervix visualized with both 7.5X and 15X magnification.  Green filter also used.  Lugols solution was not used.  Findings:  Scarring from prior LEEP noted.  Significant atrophic changed on the cervix.  Decreased Lugol's staining on 2-7 o'clock area of cervix but this may be due to the significant atrophic changes.  Biopsy:  3 and 7 o'clock.  ECC:  was performed.  Monsel's was needed.  Excellent hemostasis was present.  Pt tolerated procedure well and all  instruments were removed.  Findings noted above on picture of cervix.  Assessment:  LGSIL pap with possible HGSIL findings +HR HPV Significant atrophic changes  Plan:  Pathology results will be called to patient and follow-up planned pending results.

## 2016-04-15 ENCOUNTER — Telehealth: Payer: Self-pay | Admitting: *Deleted

## 2016-04-15 NOTE — Telephone Encounter (Signed)
Call to patient and notified of results as directed by Dr Hyacinth MeekerMiller. Recommendations for LEEP reviewed and patient agreeable to proceed. Date options discussed. Will call her back with confirmation. Patient prefers Friday date.

## 2016-04-15 NOTE — Telephone Encounter (Signed)
-----   Message from Jerene BearsMary S Miller, MD sent at 04/15/2016 12:27 PM EST ----- Please let pt know her biopsies and ecc all came back negative for abnormal cells.  However, her pap was LGSIL with possible high grade cells present.  Additional evaluation is needed.  I would recommend a LEEP in the OR due to the difficulty of her colposcopy and history of prior leep and scarring on the cervix that is present.    Pt will need another recall placed but that can be after the procedure is done.  CC:  Harland DingwallSuzy Dixon and Arnold LongEmily Caldwell

## 2016-04-27 ENCOUNTER — Telehealth: Payer: Self-pay | Admitting: Obstetrics & Gynecology

## 2016-04-27 NOTE — Telephone Encounter (Signed)
Call to patient. Advised surgery scheduled for [facility]16 at 07[facility]. Patient agreeable to date and time. Surgery instruction sheet reviewed and printed copy will be provided  In surgery pack at consult with MD tomorrow. Brochure to surgery center will be given as well.  Routing to provider for final review. Patient agreeable to disposition. Will close encounter.

## 2016-04-27 NOTE — Telephone Encounter (Signed)
Called patient to review benefits for a scheduled surgical procedure. Left Voicemail requesting a call back.

## 2016-04-28 ENCOUNTER — Ambulatory Visit (INDEPENDENT_AMBULATORY_CARE_PROVIDER_SITE_OTHER): Payer: 59 | Admitting: Obstetrics & Gynecology

## 2016-04-28 ENCOUNTER — Encounter: Payer: Self-pay | Admitting: Obstetrics & Gynecology

## 2016-04-28 VITALS — BP 108/68 | HR 70 | Resp 16 | Ht 64.25 in | Wt 161.0 lb

## 2016-04-28 DIAGNOSIS — B977 Papillomavirus as the cause of diseases classified elsewhere: Secondary | ICD-10-CM | POA: Diagnosis not present

## 2016-04-28 DIAGNOSIS — Z87440 Personal history of urinary (tract) infections: Secondary | ICD-10-CM | POA: Diagnosis not present

## 2016-04-28 DIAGNOSIS — R87612 Low grade squamous intraepithelial lesion on cytologic smear of cervix (LGSIL): Secondary | ICD-10-CM | POA: Diagnosis not present

## 2016-04-28 DIAGNOSIS — R87613 High grade squamous intraepithelial lesion on cytologic smear of cervix (HGSIL): Secondary | ICD-10-CM

## 2016-04-28 LAB — POCT URINALYSIS DIPSTICK
BILIRUBIN UA: NEGATIVE
Blood, UA: NEGATIVE
Glucose, UA: NEGATIVE
KETONES UA: NEGATIVE
LEUKOCYTES UA: NEGATIVE
Nitrite, UA: NEGATIVE
Urobilinogen, UA: NEGATIVE
pH, UA: 5

## 2016-04-30 ENCOUNTER — Other Ambulatory Visit: Payer: Self-pay | Admitting: Obstetrics & Gynecology

## 2016-04-30 NOTE — Progress Notes (Signed)
61 y.o. [id number] Divorced and remarried WF here to discuss recent colposcopy findings and recommendations for treatment.  Pt has +HR HPV testing since first started seeing providers in our practice in 2015.  Pap 02/17/14 was normal with +HR HPV.  Colposcopy was performed 05/15/14 with CIN 1, HR HPV changes noted and negative ECC.  Follow up pap smear 12/201/6 showed ASCUS finding with +HR HPV again.  We did discuss subtyping and this was performed and was negative for 16/18/45 testing.  Then Pap semar was obtained 03/17/16 showing LGSIL with a few cells suspicious for HGSIL.  Colpospocy was performed 04/11/16 showing atrophic cells.  Due to discrepency in pap smears and colposcopy findings, LEEP is recommended.  Pt has undergone one in the past.  Colposcopies in the office are really difficult for her.  we discussed need for another one but I feel doing this in the outpatient OR will allow me better visualization and improved pt comfort.    Procedure, risks and benefits reviewed including bleeding, rare risk of infection, failure rate and injury to pelvic structures.  Pt is aware that there is a small risks cancer could be found with subsequent treatment needed.  She is also aware that this could not be enough treatment and hysterectomy could be recommended.  She is ok with all of these risks and desires to proceed.  She is also aware this could be done in the office but she is in agreement with doing this in the OR.      Patient's last menstrual period was 02/17/2006.          Sexually active: Yes.   Birth control: post menopausal  Last pap: 03/17/16 LGSIL, few HGSIL, colpo 04/11/16 negative for abnormal cells Last MMG: 04/28/15 BIRADS 1 negative  Tobacco: Former smoker  Past Surgical History:  Procedure Laterality Date  . COLONOSCOPY N/A 10/15/2012   Procedure: COLONOSCOPY;  Surgeon: Charna Elizabeth, MD;  Location: [facility];  Service: Endoscopy;  Laterality: N/A;  . COLPORRHAPHY  01/27/15   Posterior- [facility], Dr. Gala Lewandowsky  . LEEP  2007  . TMJ ARTHROSCOPY    . TONSILLECTOMY AND ADENOIDECTOMY      Past Medical History:  Diagnosis Date  . Abnormal glandular Papanicolaou smear of cervix 06/2005   followed by LEEP, + HR HPV DNA 06/2008  . Allergy   . Anemia 09/2004   iron deficient  . Broken clavicle 10/23/2015   pt states she fell in bathroom   . Depression    Last psychiatrist - Dr. Melrose Nakayama - does not have records as seen prior to 2007  . Foot fracture, left 11/2006   5th MTP  . Migraine   . Osteoporosis 02/2006   DEXA 04/23/12 w/ T score L femur -2.8 and L spine -1.4  . Thyroid disease 01/2007   subclinical hyperthyroidism  . Von Willebrand's disease (HCC) 03/2005    Allergies: Macrobid [nitrofurantoin]  Current Outpatient Prescriptions  Medication Sig Dispense Refill  . ALPRAZolam (XANAX) 0.25 MG tablet Take 1 tablet (0.25 mg total) by mouth at bedtime as needed for anxiety. 30 tablet 0  . aspirin-acetaminophen-caffeine (EXCEDRIN MIGRAINE) 250-250-65 MG tablet Take 1 tablet by mouth every 8 (eight) hours as needed for headache or migraine.     . B Complex Vitamins (VITAMIN-B COMPLEX) TABS Take 1 tablet by mouth daily.     . Calcium-Vitamin D 600-200 MG-UNIT tablet Take 2 tablets by mouth daily.     . Cholecalciferol (D 1000) 1000 UNITS capsule  Take 5,000 Units by mouth daily.     Marland Kitchen. conjugated estrogens (PREMARIN) vaginal cream Apply pea-sized amount topically to urethra and inner labia daily as directed. 42.5 g 2  . FLUoxetine (PROZAC) 40 MG capsule Take 1 capsule (40 mg total) by mouth daily. 90 capsule 4  . glucosamine-chondroitin (GLUCOSAMINE-CHONDROITIN DS) 500-400 MG tablet Take 1 tablet by mouth 3 (three) times daily.     Marland Kitchen. HYDROcodone-acetaminophen (NORCO/VICODIN) 5-325 MG tablet Take 1 tablet by mouth every 4 (four) hours as needed. 10 tablet 0  . hydrocortisone-pramoxine (ANALPRAM-HC) 2.5-1 % rectal cream Place rectally.    . Magnesium Oxide 400 MG CAPS Take 2 capsules  by mouth daily.     . Multiple Vitamin tablet Take 1 tablet by mouth daily.     . NON FORMULARY D Mannose  1 a day    . Omega-3 1000 MG CAPS Take 1 g by mouth daily.     . Probiotic Product (ULTRAFLORA IMMUNE HEALTH) 170 MG CAPS Take 1 capsule by mouth.    . risedronate (ACTONEL) 35 MG tablet Take 1 tablet (35 mg total) by mouth every 7 (seven) days. with water on empty stomach, nothing by mouth or lie down for next 30 minutes. 4 tablet 9  . UNABLE TO FIND Similase    . UNABLE TO FIND UltraHepatrope    . valACYclovir (VALTREX) 500 MG tablet Take 500 mg by mouth as needed.     No current facility-administered medications for this visit.     ROS: A comprehensive review of systems was negative.  Exam:    BP 108/68 (BP Location: Right Arm, Patient Position: Sitting, Cuff Size: Normal)   Pulse 70   Resp 16   Ht 5' 4.25" (1.632 m)   Wt 161 lb (73 kg)   LMP 02/17/2006   BMI 27.42 kg/m    General appearance: alert and cooperative Head: Normocephalic, without obvious abnormality, atraumatic Neck: no adenopathy, supple, symmetrical, trachea midline and thyroid not enlarged, symmetric, no tenderness/mass/nodules Lungs: clear to auscultation bilaterally Heart: regular rate and rhythm, S1, S2 normal, no murmur, click, rub or gallop Abdomen: soft, non-tender; bowel sounds normal; no masses,  no organomegaly Extremities: extremities normal, atraumatic, no cyanosis or edema Skin: Skin color, texture, turgor normal. No rashes or lesions Lymph nodes: Cervical, supraclavicular, and axillary nodes normal. no inguinal nodes palpated Neurologic: Grossly normal  Pelvic: External genitalia:  no lesions              Urethra: normal appearing urethra with no masses, tenderness or lesions              Bartholins and Skenes: normal                 Vagina: normal appearing vagina with normal color and discharge, no lesions, atrophic              Cervix: posterior aspect of cervix is flush with vagina               Pap taken: No        Bimanual Exam:  Uterus:  uterus is normal size, shape, consistency and nontender                                      Adnexa:    normal adnexa in size, nontender and no masses  Anus:  No visible lesions  A: LGSIL pap smear with possible HGSIL findngs, colposcopy was not concordant  H/O prior LEEP +HR HPV VonWillibrand's disease (has never had excessive bleeding or taken medication for this.  Will be seeing Dr. Myna Hidalgo for doing additional testing.)    P:  Colposcopy with LEEP planned Post op Rx's will be given day of procedure Pt knows to stop any ASA product and we reviewed herbal and supplements that should be stopped Pre op and post op instructions provided Medications/Vitamins reviewed.  Pt knows needs to stop  ~25 minutes spent with patient >50% of time was in face to face discussion of above.

## 2016-05-02 ENCOUNTER — Ambulatory Visit
Admission: RE | Admit: 2016-05-02 | Discharge: 2016-05-02 | Disposition: A | Discharge status: Home / Self Care | Payer: 59 | Source: Ambulatory Visit | Attending: Family Medicine | Admitting: Family Medicine

## 2016-05-02 DIAGNOSIS — Z1231 Encounter for screening mammogram for malignant neoplasm of breast: Secondary | ICD-10-CM

## 2016-05-04 ENCOUNTER — Encounter (HOSPITAL_BASED_OUTPATIENT_CLINIC_OR_DEPARTMENT_OTHER): Payer: Self-pay | Admitting: *Deleted

## 2016-05-04 NOTE — Telephone Encounter (Signed)
Called patient to review benefits for a recommended surgical procedure. Left Voicemail requesting a call back. °

## 2016-05-04 NOTE — Progress Notes (Signed)
[facility], Ekg with chart. Instructed Npo after Mn- may take prozac with water in am.

## 2016-05-06 ENCOUNTER — Ambulatory Visit (HOSPITAL_BASED_OUTPATIENT_CLINIC_OR_DEPARTMENT_OTHER)
Admission: RE | Admit: 2016-05-06 | Discharge: 2016-05-06 | Disposition: A | Discharge status: Home / Self Care | Payer: 59 | Source: Ambulatory Visit | Attending: Obstetrics & Gynecology | Admitting: Obstetrics & Gynecology

## 2016-05-06 ENCOUNTER — Encounter (HOSPITAL_BASED_OUTPATIENT_CLINIC_OR_DEPARTMENT_OTHER): Payer: Self-pay | Admitting: *Deleted

## 2016-05-06 ENCOUNTER — Ambulatory Visit (HOSPITAL_BASED_OUTPATIENT_CLINIC_OR_DEPARTMENT_OTHER): Payer: 59 | Admitting: Anesthesiology

## 2016-05-06 ENCOUNTER — Encounter (HOSPITAL_BASED_OUTPATIENT_CLINIC_OR_DEPARTMENT_OTHER)
Admission: RE | Disposition: A | Discharge status: Home / Self Care | Payer: Self-pay | Source: Ambulatory Visit | Attending: Obstetrics & Gynecology

## 2016-05-06 DIAGNOSIS — Z79899 Other long term (current) drug therapy: Secondary | ICD-10-CM | POA: Insufficient documentation

## 2016-05-06 DIAGNOSIS — R87613 High grade squamous intraepithelial lesion on cytologic smear of cervix (HGSIL): Secondary | ICD-10-CM | POA: Diagnosis not present

## 2016-05-06 DIAGNOSIS — Z87891 Personal history of nicotine dependence: Secondary | ICD-10-CM | POA: Diagnosis not present

## 2016-05-06 DIAGNOSIS — R87810 Cervical high risk human papillomavirus (HPV) DNA test positive: Secondary | ICD-10-CM | POA: Diagnosis not present

## 2016-05-06 DIAGNOSIS — R300 Dysuria: Secondary | ICD-10-CM

## 2016-05-06 DIAGNOSIS — Z79890 Hormone replacement therapy (postmenopausal): Secondary | ICD-10-CM | POA: Diagnosis not present

## 2016-05-06 DIAGNOSIS — Z7983 Long term (current) use of bisphosphonates: Secondary | ICD-10-CM | POA: Insufficient documentation

## 2016-05-06 DIAGNOSIS — R87612 Low grade squamous intraepithelial lesion on cytologic smear of cervix (LGSIL): Secondary | ICD-10-CM | POA: Diagnosis not present

## 2016-05-06 DIAGNOSIS — R87615 Unsatisfactory cytologic smear of cervix: Secondary | ICD-10-CM | POA: Diagnosis not present

## 2016-05-06 DIAGNOSIS — M810 Age-related osteoporosis without current pathological fracture: Secondary | ICD-10-CM | POA: Insufficient documentation

## 2016-05-06 DIAGNOSIS — F419 Anxiety disorder, unspecified: Secondary | ICD-10-CM | POA: Diagnosis not present

## 2016-05-06 HISTORY — DX: Anxiety disorder, unspecified: F41.9

## 2016-05-06 HISTORY — PX: LEEP: SHX91

## 2016-05-06 LAB — POCT HEMOGLOBIN-HEMACUE: Hemoglobin: 13.6 g/dL (ref 12.0–15.0)

## 2016-05-06 SURGERY — LEEP (LOOP ELECTROSURGICAL EXCISION PROCEDURE)
Anesthesia: General

## 2016-05-06 MED ORDER — LIDOCAINE HCL (CARDIAC) 20 MG/ML IV SOLN
INTRAVENOUS | Status: DC | PRN
  Administered 2016-05-06: 100 mg via INTRAVENOUS

## 2016-05-06 MED ORDER — MIDAZOLAM HCL 5 MG/5ML IJ SOLN
INTRAMUSCULAR | Status: DC | PRN
  Administered 2016-05-06: 2 mg via INTRAVENOUS

## 2016-05-06 MED ORDER — PROMETHAZINE HCL 25 MG/ML IJ SOLN
6.2500 mg | INTRAMUSCULAR | Status: DC | PRN
  Filled 2016-05-06: qty 1

## 2016-05-06 MED ORDER — ONDANSETRON HCL 4 MG/2ML IJ SOLN
INTRAMUSCULAR | Status: AC
  Filled 2016-05-06: qty 2

## 2016-05-06 MED ORDER — DEXAMETHASONE SODIUM PHOSPHATE 4 MG/ML IJ SOLN
INTRAMUSCULAR | Status: DC | PRN
  Administered 2016-05-06: 4 mg via INTRAVENOUS

## 2016-05-06 MED ORDER — FENTANYL CITRATE (PF) 100 MCG/2ML IJ SOLN
INTRAMUSCULAR | Status: DC | PRN
  Administered 2016-05-06 (×4): 25 ug via INTRAVENOUS

## 2016-05-06 MED ORDER — MIDAZOLAM HCL 2 MG/2ML IJ SOLN
INTRAMUSCULAR | Status: AC
  Filled 2016-05-06: qty 4

## 2016-05-06 MED ORDER — PROPOFOL 10 MG/ML IV BOLUS
INTRAVENOUS | Status: AC
  Filled 2016-05-06: qty 40

## 2016-05-06 MED ORDER — FENTANYL CITRATE (PF) 100 MCG/2ML IJ SOLN
INTRAMUSCULAR | Status: AC
  Filled 2016-05-06: qty 2

## 2016-05-06 MED ORDER — LIDOCAINE 2% (20 MG/ML) 5 ML SYRINGE
INTRAMUSCULAR | Status: AC
  Filled 2016-05-06: qty 5

## 2016-05-06 MED ORDER — LIDOCAINE-EPINEPHRINE (PF) 1 %-1:200000 IJ SOLN
INTRAMUSCULAR | Status: DC | PRN
  Administered 2016-05-06: 8 mL

## 2016-05-06 MED ORDER — ESTROGENS, CONJUGATED 0.625 MG/GM VA CREA: TOPICAL_CREAM | VAGINAL | 2 refills | Status: DC

## 2016-05-06 MED ORDER — MIDAZOLAM HCL 2 MG/2ML IJ SOLN
INTRAMUSCULAR | Status: AC
  Filled 2016-05-06: qty 2

## 2016-05-06 MED ORDER — KETOROLAC TROMETHAMINE 30 MG/ML IJ SOLN
INTRAMUSCULAR | Status: AC
  Filled 2016-05-06: qty 1

## 2016-05-06 MED ORDER — ACETIC ACID 4% SOLUTION
Status: DC | PRN
  Administered 2016-05-06: 1 via TOPICAL

## 2016-05-06 MED ORDER — HYDROCODONE-ACETAMINOPHEN 5-325 MG PO TABS: 1.0000 | ORAL_TABLET | ORAL | 0 refills | Status: DC | PRN

## 2016-05-06 MED ORDER — DEXAMETHASONE SODIUM PHOSPHATE 10 MG/ML IJ SOLN
INTRAMUSCULAR | Status: AC
  Filled 2016-05-06: qty 1

## 2016-05-06 MED ORDER — PROPOFOL 10 MG/ML IV BOLUS
INTRAVENOUS | Status: DC | PRN
Start: 2016-05-06 — End: 2016-05-06
  Administered 2016-05-06: 200 mg via INTRAVENOUS

## 2016-05-06 MED ORDER — LACTATED RINGERS IV SOLN
INTRAVENOUS | Status: DC
  Administered 2016-05-06 (×3): via INTRAVENOUS
  Filled 2016-05-06: qty 1000

## 2016-05-06 MED ORDER — IODINE STRONG (LUGOLS) 5 % PO SOLN
ORAL | Status: DC | PRN
  Administered 2016-05-06: 0.2 mL via ORAL

## 2016-05-06 MED ORDER — HYDROMORPHONE HCL 1 MG/ML IJ SOLN
0.2500 mg | INTRAMUSCULAR | Status: DC | PRN
  Filled 2016-05-06: qty 0.5

## 2016-05-06 SURGICAL SUPPLY — 30 items
BLADE SURG 11 STRL SS (BLADE) ×2 IMPLANT
CANISTER SUCTION 1200CC (MISCELLANEOUS) IMPLANT
CANISTER SUCTION 2500CC (MISCELLANEOUS) IMPLANT
COVER BACK TABLE 60X90IN (DRAPES) ×2 IMPLANT
DRAPE LG THREE QUARTER DISP (DRAPES) ×2 IMPLANT
DRAPE UNDERBUTTOCKS STRL (DRAPE) ×2 IMPLANT
ELECT BALL LEEP 3MM BLK (ELECTRODE) IMPLANT
ELECT BALL LEEP 5MM RED (ELECTRODE) ×2 IMPLANT
ELECT LOOP LEEP RND 10X10 YLW (CUTTING LOOP) ×2
ELECT LOOP LEEP RND 15X12 GRN (CUTTING LOOP)
ELECT LOOP LEEP RND 20X12 WHT (CUTTING LOOP)
ELECTRODE LOOP LP RND 10X10YLW (CUTTING LOOP) ×1 IMPLANT
ELECTRODE LOOP LP RND 15X12GRN (CUTTING LOOP) IMPLANT
ELECTRODE LOOP LP RND 20X12WHT (CUTTING LOOP) IMPLANT
GLOVE BIOGEL PI IND STRL 7.0 (GLOVE) ×1 IMPLANT
GLOVE BIOGEL PI INDICATOR 7.0 (GLOVE) ×1
GLOVE ECLIPSE 6.5 STRL STRAW (GLOVE) ×4 IMPLANT
GOWN STRL REUS W/ TWL LRG LVL3 (GOWN DISPOSABLE) ×1 IMPLANT
GOWN STRL REUS W/TWL LRG LVL3 (GOWN DISPOSABLE) ×1
KIT ROOM TURNOVER WOR (KITS) ×2 IMPLANT
LEGGING LITHOTOMY PAIR STRL (DRAPES) ×2 IMPLANT
PACK BASIN DAY SURGERY FS (CUSTOM PROCEDURE TRAY) ×2 IMPLANT
PAD OB MATERNITY 4.3X12.25 (PERSONAL CARE ITEMS) ×2 IMPLANT
PAD PREP 24X48 CUFFED NSTRL (MISCELLANEOUS) ×2 IMPLANT
SUT VIC AB 0 CT1 36 (SUTURE) IMPLANT
TOWEL OR 17X24 6PK STRL BLUE (TOWEL DISPOSABLE) ×2 IMPLANT
TRAY DSU PREP LF (CUSTOM PROCEDURE TRAY) ×2 IMPLANT
TUBE CONNECTING 12X1/4 (SUCTIONS) ×2 IMPLANT
VACUUM HOSE/TUBING 7/8INX6FT (MISCELLANEOUS) ×2 IMPLANT
YANKAUER SUCT BULB TIP NO VENT (SUCTIONS) ×2 IMPLANT

## 2016-05-06 NOTE — H&P (Signed)
Haley Buchanan is an 61 y.o. female  [phone] remarried WF here for colposcopy and LEEP due to LGSIL pap with cells suspicious for HGSIL and persistent HR HPV as well as discordant pap and biopsy finidings.  Pt has had ongoing abnormal pap smear issues for, at least the last 10 years, when she had an AGUS pap and underwent a LEEP for evaluation.  She's had persistent HR HPV since, at least, 4/10.  The most recent colposcopy was very uncomforable for the patient so we have decided to proceed with this in the OR.  Risks of bleeding, failure, need for additional procedures, scarring all reviewed as well as injury to surrounding structures.  Pt's questions have all been answered.  She is here and ready to proceed.  Pertinent Gynecological History: Menses: post-menopausal Bleeding: none Contraception: PMP DES exposure: denies Blood transfusions: none Sexually transmitted diseases: no past history Previous GYN Procedures: several colposcopies and a LEEP in 2007  Last mammogram: normal Date: 2/18 Last pap: abnormal: LGSIL pap with cells suspicious for HGSIL Date: 03/17/16 OB History: G5, P2   Menstrual History: Patient's last menstrual period was 02/17/2006.    Past Medical History:  Diagnosis Date  . Abnormal glandular Papanicolaou smear of cervix 06/2005   followed by LEEP, + HR HPV DNA 06/2008  . Allergy   . Anemia 09/2004   iron deficient  . Anxiety   . Broken clavicle 10/23/2015   pt states she fell in bathroom   . Depression    Last psychiatrist - Dr. Melrose Nakayama - does not have records as seen prior to 2007  . Foot fracture, left 11/2006   5th MTP  . Migraine   . Osteoporosis 02/2006   DEXA 04/23/12 w/ T score L femur -2.8 and L spine -1.4  . Thyroid disease 01/2007   subclinical hyperthyroidism  . Von Willebrand's disease (HCC) 03/2005    Past Surgical History:  Procedure Laterality Date  . COLONOSCOPY N/A 10/15/2012   Procedure: COLONOSCOPY;  Surgeon: Charna Elizabeth, MD;  Location:  [facility];  Service: Endoscopy;  Laterality: N/A;  . COLPORRHAPHY  01/27/15   Posterior- [facility], Dr. Gala Lewandowsky  . LEEP  2007  . TMJ ARTHROSCOPY  1996  . TONSILLECTOMY AND ADENOIDECTOMY      Family History  Problem Relation Age of Onset  . Osteoporosis Mother   . Heart Problems Mother   . Thyroid disease Mother   . Cancer Father   . Ulcers Father   . Alcohol abuse Father   . Osteoporosis Maternal Grandmother   . Alzheimer's disease Maternal Grandfather   . Depression Daughter   . Psychiatric Illness Brother     Social History:  reports that she quit smoking about 17 years ago. Her smoking use included Cigarettes. She has a 2.00 pack-year smoking history. She has never used smokeless tobacco. She reports that she drinks about 1.2 oz of alcohol per week . She reports that she does not use drugs.  Allergies:  Allergies  Allergen Reactions  . Macrobid [Nitrofurantoin] Nausea And Vomiting    Prescriptions Prior to Admission  Medication Sig Dispense Refill Last Dose  . aspirin-acetaminophen-caffeine (EXCEDRIN MIGRAINE) 250-250-65 MG tablet Take 1 tablet by mouth every 8 (eight) hours as needed for headache or migraine.    Past Week at Unknown time  . B Complex Vitamins (VITAMIN-B COMPLEX) TABS Take 1 tablet by mouth daily.    Past Week at Unknown time  . Calcium-Vitamin D 600-200 MG-UNIT tablet Take 2 tablets  by mouth daily.    Past Week at Unknown time  . Cholecalciferol (D 1000) 1000 UNITS capsule Take 5,000 Units by mouth daily.    Past Week at Unknown time  . conjugated estrogens (PREMARIN) vaginal cream Apply pea-sized amount topically to urethra and inner labia daily as directed. 42.5 g 2 05/05/2016 at Unknown time  . FLUoxetine (PROZAC) 40 MG capsule Take 1 capsule (40 mg total) by mouth daily. 90 capsule 4 Past Week at Unknown time  . glucosamine-chondroitin (GLUCOSAMINE-CHONDROITIN DS) 500-400 MG tablet Take 1 tablet by mouth 3 (three) times daily.    Past Week at Unknown  time  . HYDROcodone-acetaminophen (NORCO/VICODIN) 5-325 MG tablet Take 1 tablet by mouth every 4 (four) hours as needed. 10 tablet 0 Past Month at Unknown time  . hydrocortisone-pramoxine (ANALPRAM-HC) 2.5-1 % rectal cream Place rectally.   Past Week at Unknown time  . Magnesium Oxide 400 MG CAPS Take 2 capsules by mouth daily.    Past Week at Unknown time  . Multiple Vitamin tablet Take 1 tablet by mouth daily.    Past Week at Unknown time  . NON FORMULARY every morning. D Mannose  Daily ( UTI symptoms)   Past Week at Unknown time  . Omega-3 1000 MG CAPS Take 1 g by mouth daily.    Past Week at Unknown time  . Probiotic Product (ULTRAFLORA IMMUNE HEALTH) 170 MG CAPS Take 1 capsule by mouth.   Past Week at Unknown time  . risedronate (ACTONEL) 35 MG tablet Take 1 tablet (35 mg total) by mouth every 7 (seven) days. with water on empty stomach, nothing by mouth or lie down for next 30 minutes. 4 tablet 9 Past Week at Unknown time  . UNABLE TO FIND Similase   Past Week at Unknown time  . UNABLE TO FIND Ultra Hepa trope   Past Week at Unknown time  . ALPRAZolam (XANAX) 0.25 MG tablet Take 1 tablet (0.25 mg total) by mouth at bedtime as needed for anxiety. 30 tablet 0 More than a month at Unknown time  . valACYclovir (VALTREX) 500 MG tablet Take 500 mg by mouth as needed.   More than a month at Unknown time    Review of Systems  All other systems reviewed and are negative.   Blood pressure 112/68, pulse 85, temperature 98.5 F (36.9 C), temperature source Oral, resp. rate 16, height 5' 4.25" (1.632 m), weight 158 lb (71.7 kg), last menstrual period 02/17/2006, SpO2 96 %. Physical Exam  Constitutional: She is oriented to person, place, and time. She appears well-developed and well-nourished.  Cardiovascular: Normal rate and regular rhythm.   Respiratory: Effort normal and breath sounds normal.  Neurological: She is alert and oriented to person, place, and time.  Skin: Skin is warm and dry.   Psychiatric: She has a normal mood and affect.    Results for orders placed or performed during the hospital encounter of 05/06/16 (from the past 24 hour(s))  Hemoglobin-hemacue, POC     Status: None   Collection Time: 05/06/16  6:52 AM  Result Value Ref Range   Hemoglobin 13.6 12.0 - 15.0 g/dL    No results found.  Assessment/Plan: 61 yo G5P2 remarried WF with LGSIL pap, cells suspicious for HGSIL and discordant pap and biopsy findings here for colposcopy and LEEP in OR.  This is also being done due to pt discomfort in the office.  All questions answered.  Pt ready to proceed.  Valentina ShaggyMILLER, Calleen Alvis SUZANNE 05/06/2016, 7:15  AM

## 2016-05-06 NOTE — Discharge Instructions (Addendum)
Post Anesthesia Home Care Instructions  Activity: Get plenty of rest for the remainder of the day. A responsible adult should stay with you for 24 hours following the procedure.  For the next 24 hours, DO NOT: -Drive a car -Advertising copywriter -Drink alcoholic beverages -Take any medication unless instructed by your physician -Make any legal decisions or sign important papers.  Meals: Start with liquid foods such as gelatin or soup. Progress to regular foods as tolerated. Avoid greasy, spicy, heavy foods. If nausea and/or vomiting occur, drink only clear liquids until the nausea and/or vomiting subsides. Call your physician if vomiting continues.  Special Instructions/Symptoms: Your throat may feel dry or sore from the anesthesia or the breathing tube placed in your throat during surgery. If this causes discomfort, gargle with warm salt water. The discomfort should disappear within 24 hours.  If you had a scopolamine patch placed behind your ear for the management of post- operative nausea and/or vomiting:  1. The medication in the patch is effective for 72 hours, after which it should be removed.  Wrap patch in a tissue and discard in the trash. Wash hands thoroughly with soap and water. 2. You may remove the patch earlier than 72 hours if you experience unpleasant side effects which may include dry mouth, dizziness or visual disturbances. 3. Avoid touching the patch. Wash your hands with soap and water after contact with the patch.   Post-surgical Instructions, Outpatient Surgery  You may expect to feel dizzy, weak, and drowsy for as long as 24 hours after receiving the medicine that made you sleep (anesthetic). For the first 24 hours after your surgery:    Do not drive a car, ride a bicycle, participate in physical activities, or take public transportation until you are done taking narcotic pain medicines or as directed by Dr. Hyacinth Meeker.   Do not drink alcohol or take tranquilizers.    Do not take medicine that has not been prescribed by your physicians.   Do not sign important papers or make important decisions while on narcotic pain medicines.   Have a responsible person with you.   PAIN MANAGEMENT  Motrin 800mg .  (This is the same as 4-200mg  over the counter tablets of Motrin or ibuprofen.)  You may take this every eight hours or as needed for cramping.    Vicodin 5/325mg .  For more severe pain, take one or two tablets every four to six hours as needed for pain control.  (Remember that narcotic pain medications increase your risk of constipation.  If this becomes a problem, you may take an over the counter stool softener like Colace 100mg  up to four times a day.)  DO'S AND DON'T'S  Do not take a tub bath for one week.  You may shower on the first day after your surgery  Do not do any heavy lifting for one to two weeks.  This increases the chance of bleeding.  Do move around as you feel able.  Stairs are fine.  You may begin to exercise again as you feel able.  Do not lift any weights for two weeks.  Do not put anything in the vagina for FOUR weeks--no tampons, intercourse, or douching.    REGULAR MEDIATIONS/VITAMINS:  You may restart all of your regular medications as prescribed.  You may restart all of your vitamins as you normally take them.    PLEASE CALL OR SEEK MEDICAL CARE IF:  You have persistent nausea and vomiting.   You have trouble eating  or drinking.   You have an oral temperature above 100.5.   You have constipation that is not helped by adjusting diet or increasing fluid intake. Pain medicines are a common cause of constipation.   You have heavy vaginal bleeding

## 2016-05-06 NOTE — Anesthesia Procedure Notes (Signed)
Procedure Name: LMA Insertion Date/Time: 05/06/2016 7:37 AM Performed by: Jessica PriestBEESON, Montasia Chisenhall C Pre-anesthesia Checklist: Patient identified, Emergency Drugs available, Suction available and Patient being monitored Patient Re-evaluated:Patient Re-evaluated prior to inductionOxygen Delivery Method: Circle system utilized Preoxygenation: Pre-oxygenation with 100% oxygen Intubation Type: IV induction Ventilation: Mask ventilation without difficulty LMA: LMA inserted LMA Size: 4.0 Number of attempts: 1 Airway Equipment and Method: Bite block Placement Confirmation: positive ETCO2 and breath sounds checked- equal and bilateral Tube secured with: Tape Dental Injury: Teeth and Oropharynx as per pre-operative assessment

## 2016-05-06 NOTE — Op Note (Signed)
05/06/2016  8:06 AM  PATIENT:  Haley Buchanan  61 y.o. female  PRE-OPERATIVE DIAGNOSIS:  LGSIL pap/ HGSIL on pap/ previous LEEP, discordant pap and biopsy findings on in office colposcopy  POST-OPERATIVE DIAGNOSIS:  LGSIL pap/ HGSIL on pap/ previous LEEP, discordant pap and biopsy findings on in office colposcopy   PROCEDURE:  Procedure(s): LOOP ELECTROSURGICAL EXCISION PROCEDURE (LEEP) with colpo   SURGEON:  Pernie Grosso SUZANNE  ASSISTANTS: OR staff   ANESTHESIA:   general with LMA  ESTIMATED BLOOD LOSS: 5cc  BLOOD ADMINISTERED:none   FLUIDS: 1000cc LR  UOP: 150cc  SPECIMEN:  Cervical cone biopsy with stitch at 12 o'clock and second specimen at 9 o'clock, ECC, 3 o'clock biopsy  DISPOSITION OF SPECIMEN:  PATHOLOGY  FINDINGS: atrophic vaginal tissue, Decreased staining with lugol's lateral to the cervical os out towards to vagina at 3 o'clock.    DESCRIPTION OF OPERATION:  Patient was taken to the operating room.  She is placed in the supine position. SCDs were on her lower extremities and functioning properly. General anesthesia with an LMA was administered without difficulty.  Legs were then placed in the [facility] stirrups in the low lithotomy position. Grounding pad was on the left thigh.  The legs were lifted to the high lithotomy position and the Betadine prep was used on the inner thighs perineum and vagina x3. Patient was draped in a normal standard fashion. An in and out catheterization with a red rubber Foley catheter was performed. Approximately 150 cc of clear urine was noted. A painted  speculum was placed the vagina. The anterior lip of the cervix was grasped with single-tooth tenaculum.  A paracervical block of 1% lidocaine mixed one-to-one with epinephrine (1:200,000 units).  8 cc was used total.  Good visualization of the cervix was noted.  With 12 x maginification, the entire cervix was visualized.  No AWE was noted however there was a slightly raised and thicker  appearing area at 3 o'clock that was near where the vaginal tissue approximated the cervix.  I felt this was too lateral for being part of the LEEP.  Lugol's solution was applied.  Decreased staining was noted at this location.  Biopsy was obtained.  Then with settings of 55/55 and using a 10 x 10 loop, the LEEP was performed to obtain the entire transition zone.  This was performed with two passes.  Suction was present on the painted speculum.  Then using a #11 pratt dilator, the deep cervical os was noted and an ECC was performed.  Specimens were tagged and handed off.  Hemostasis obtained with ball cautery and Monsel's solution.  All instruments were removed from the vagina.  The prep was cleansed of the patient's skin. The legs are positioned back in the supine position. Sponge, lap, needle, initially counts were correct x2. Patient was taken to recovery in stable condition.  COUNTS:  YES  PLAN OF CARE: Transfer to PACU

## 2016-05-06 NOTE — Transfer of Care (Signed)
Immediate Anesthesia Transfer of Care Note  Patient: Haley Buchanan  Procedure(s) Performed: Procedure(s) (LRB): LOOP ELECTROSURGICAL EXCISION PROCEDURE (LEEP) with colposcopy  (N/A)  Patient Location: PACU  Anesthesia Type: General  Level of Consciousness: awake, sedated, patient cooperative and responds to stimulation  Airway & Oxygen Therapy: Patient Spontanous Breathing and Patient connected to [place] O2   Post-op Assessment: Report given to PACU RN, Post -op Vital signs reviewed and stable and Patient moving all extremities  Post vital signs: Reviewed and stable  Complications: No apparent anesthesia complications

## 2016-05-06 NOTE — Anesthesia Postprocedure Evaluation (Addendum)
Anesthesia Post Note  Patient: Lattie HawMelanie Sparks  Procedure(s) Performed: Procedure(s) (LRB): LOOP ELECTROSURGICAL EXCISION PROCEDURE (LEEP) with colposcopy  (N/A)  Patient location during evaluation: PACU Anesthesia Type: General Level of consciousness: awake and alert Pain management: pain level controlled Vital Signs Assessment: post-procedure vital signs reviewed and stable Respiratory status: spontaneous breathing, nonlabored ventilation, respiratory function stable and patient connected to nasal cannula oxygen Cardiovascular status: blood pressure returned to baseline and stable Postop Assessment: no signs of nausea or vomiting Anesthetic complications: no       Last Vitals:  Vitals:   05/06/16 0915 05/06/16 0930  BP: 105/65 109/75  Pulse: 72 75  Resp: 12 13  Temp:      Last Pain:  Vitals:   05/06/16 0627  TempSrc:   PainSc: 2                  [place] Current,JAMES TERRILL

## 2016-05-06 NOTE — Anesthesia Preprocedure Evaluation (Addendum)
Anesthesia Evaluation  Patient identified by MRN, date of birth, ID band Patient awake    Reviewed: Allergy & Precautions, NPO status , Patient's Chart, lab work & pertinent test results  History of Anesthesia Complications Negative for: history of anesthetic complications  Airway Mallampati: II  TM Distance: >3 FB Neck ROM: Full    Dental  (+) Teeth Intact   Pulmonary neg pulmonary ROS, former smoker,    breath sounds clear to auscultation       Cardiovascular negative cardio ROS   Rhythm:Regular Rate:Normal     Neuro/Psych    GI/Hepatic negative GI ROS, Neg liver ROS,   Endo/Other  negative endocrine ROSHyperthyroidism   Renal/GU negative Renal ROS     Musculoskeletal   Abdominal   Peds  Hematology  (+) Blood dyscrasia, , Von Willebrands   Anesthesia Other Findings   Reproductive/Obstetrics                           Anesthesia Physical Anesthesia Plan  ASA: II  Anesthesia Plan: General   Post-op Pain Management:    Induction: Intravenous  Airway Management Planned: LMA  Additional Equipment:   Intra-op Plan:   Post-operative Plan: Extubation in OR  Informed Consent: I have reviewed the patients History and Physical, chart, labs and discussed the procedure including the risks, benefits and alternatives for the proposed anesthesia with the patient or authorized representative who has indicated his/her understanding and acceptance.     Plan Discussed with:   Anesthesia Plan Comments:         Anesthesia Quick Evaluation

## 2016-05-09 ENCOUNTER — Encounter (HOSPITAL_BASED_OUTPATIENT_CLINIC_OR_DEPARTMENT_OTHER): Payer: Self-pay | Admitting: Obstetrics & Gynecology

## 2016-05-25 ENCOUNTER — Ambulatory Visit (HOSPITAL_BASED_OUTPATIENT_CLINIC_OR_DEPARTMENT_OTHER): Payer: 59 | Admitting: Hematology & Oncology

## 2016-05-25 ENCOUNTER — Other Ambulatory Visit (HOSPITAL_BASED_OUTPATIENT_CLINIC_OR_DEPARTMENT_OTHER): Payer: 59

## 2016-05-25 ENCOUNTER — Ambulatory Visit: Payer: 59

## 2016-05-25 VITALS — BP 144/84 | HR 69 | Temp 97.8°F | Resp 20 | Ht 64.5 in | Wt 157.8 lb

## 2016-05-25 DIAGNOSIS — D6800 Von Willebrand disease, unspecified: Secondary | ICD-10-CM

## 2016-05-25 DIAGNOSIS — Z87891 Personal history of nicotine dependence: Secondary | ICD-10-CM | POA: Diagnosis not present

## 2016-05-25 DIAGNOSIS — Z809 Family history of malignant neoplasm, unspecified: Secondary | ICD-10-CM

## 2016-05-25 LAB — CBC WITH DIFFERENTIAL (CANCER CENTER ONLY)
BASO#: 0 10*3/uL (ref 0.0–0.2)
BASO%: 0.3 % (ref 0.0–2.0)
EOS ABS: 0.2 10*3/uL (ref 0.0–0.5)
EOS%: 3.4 % (ref 0.0–7.0)
HEMATOCRIT: 37.7 % (ref 34.8–46.6)
HEMOGLOBIN: 12.6 g/dL (ref 11.6–15.9)
LYMPH#: 2.2 10*3/uL (ref 0.9–3.3)
LYMPH%: 32.1 % (ref 14.0–48.0)
MCH: 29.5 pg (ref 26.0–34.0)
MCHC: 33.4 g/dL (ref 32.0–36.0)
MCV: 88 fL (ref 81–101)
MONO#: 0.5 10*3/uL (ref 0.1–0.9)
MONO%: 8.1 % (ref 0.0–13.0)
NEUT%: 56.1 % (ref 39.6–80.0)
NEUTROS ABS: 3.8 10*3/uL (ref 1.5–6.5)
Platelets: 251 10*3/uL (ref 145–400)
RBC: 4.27 10*6/uL (ref 3.70–5.32)
RDW: 14.8 % (ref 11.1–15.7)
WBC: 6.7 10*3/uL (ref 3.9–10.0)

## 2016-05-25 LAB — CHCC SATELLITE - SMEAR

## 2016-05-25 NOTE — Progress Notes (Signed)
Referral MD  Reason for Referral: Von Willebrand's disease   Chief Complaint  Patient presents with  . Initial Assessment    "I have Von Willebrand's"  : I'm here to see what type of von Willebrand's disease I have.  HPI: Haley Buchanan is a very nice 61 year old white female. She is followed by Haley Buchanan of gynecology. She also is followed by Haley Buchanan.  She apparently has a diagnosis of von Willebrand's disease. This apparently was diagnosed down in [place]. She lived in [place]. She actually went to a branch of the [facility]. She had lab work done. She had a normal PTT of 29 seconds. Her factor VIII level was mildly low at 58%. She had a normal von Willebrand antigen of 74. Her Ristocetin cofactor was 60.  She was seen because she had a history of bruising. Should history of heavy monthly cycles. There is no history of bleeding in the family. Per she has had multiple surgeries without bleeding. She has had wisdom teeth taken out without any bleeding. She had her tonsils taken out 61 years old without bleeding.  She is a former smoker. She does not drink. Per she has had some issues with iron deficiency. She has had a colonoscopy. She is pretty diligent with having her general healthcare assessments 1.  She saw Dr. Canary Brim down in [place]. Again he thought that she had mild von Willebrand disease.  She sees Haley Buchanan of gynecology. She recently had a LEEP procedure done. The pathology report did show some small areas of high-grade dysplasia. I don't think there was much bleeding with the procedure.  She did have a surgical procedure at [facility] back in October 2016. She had a rectocele. I do not see any obvious coagulation studies that were done preop. There is no mention of any bleeding during the procedure.  She has had 3 pregnancies. She has had a couple miscarriages. She had no problems with the pregnancy with delivery.  She thinks one of her daughters may have had  some heavy cycles.  She has 2 grandchildren. They are healthy.   she was kindly refer to the [facility] to see we could further identify the von Willebrand type.     Past Medical History:  Diagnosis Date  . Abnormal glandular Papanicolaou smear of cervix 06/2005   followed by LEEP, + HR HPV DNA 06/2008  . Allergy   . Anemia 09/2004   iron deficient  . Anxiety   . Broken clavicle 10/23/2015   pt states she fell in bathroom   . Depression    Last psychiatrist - Dr. Melrose Nakayama - does not have records as seen prior to 2007  . Foot fracture, left 11/2006   5th MTP  . Migraine   . Osteoporosis 02/2006   DEXA 04/23/12 w/ T score L femur -2.8 and L spine -1.4  . Thyroid disease 01/2007   subclinical hyperthyroidism  . Von Willebrand's disease (HCC) 03/2005  :  Past Surgical History:  Procedure Laterality Date  . COLONOSCOPY N/A 10/15/2012   Procedure: COLONOSCOPY;  Surgeon: Charna Elizabeth, MD;  Location: [facility];  Service: Endoscopy;  Laterality: N/A;  . COLPORRHAPHY  01/27/15   Posterior- [facility], Dr. Gala Lewandowsky  . LEEP  2007  . LEEP N/A 05/06/2016   Procedure: LOOP ELECTROSURGICAL EXCISION PROCEDURE (LEEP) with colposcopy ;  Surgeon: Jerene Bears, MD;  Location: [facility];  Service: Gynecology;  Laterality: N/A;  . TMJ  ARTHROSCOPY  1996  . TONSILLECTOMY AND ADENOIDECTOMY    :   Current Outpatient Prescriptions:  .  ALPRAZolam (XANAX) 0.25 MG tablet, Take 1 tablet (0.25 mg total) by mouth at bedtime as needed for anxiety., Disp: 30 tablet, Rfl: 0 .  aspirin-acetaminophen-caffeine (EXCEDRIN MIGRAINE) 250-250-65 MG tablet, Take 1 tablet by mouth every 8 (eight) hours as needed for headache or migraine. , Disp: , Rfl:  .  Calcium-Vitamin D 600-200 MG-UNIT tablet, Take 2 tablets by mouth daily. , Disp: , Rfl:  .  Cholecalciferol (VITAMIN D3) 5000 units CAPS, Take 5,000 Units by mouth once a week., Disp: , Rfl:  .  conjugated estrogens (PREMARIN)  vaginal cream, Apply pea-sized amount topically to urethra and inner labia daily as directed.  Do not apply intravaginally for two weeks post op., Disp: 42.5 g, Rfl: 2 .  FLUoxetine (PROZAC) 40 MG capsule, Take 1 capsule (40 mg total) by mouth daily., Disp: 90 capsule, Rfl: 4 .  glucosamine-chondroitin (GLUCOSAMINE-CHONDROITIN DS) 500-400 MG tablet, Take 1 tablet by mouth 2 times daily at 12 noon and 4 pm. , Disp: , Rfl:  .  HYDROcodone-acetaminophen (NORCO/VICODIN) 5-325 MG tablet, Take 1-2 tablets by mouth every 4 (four) hours as needed., Disp: 10 tablet, Rfl: 0 .  hydrocortisone-pramoxine (ANALPRAM-HC) 2.5-1 % rectal cream, Place rectally., Disp: , Rfl:  .  Magnesium Oxide 400 MG CAPS, Take 2 capsules by mouth daily. , Disp: , Rfl:  .  Multiple Vitamin tablet, Take 1 tablet by mouth daily. , Disp: , Rfl:  .  NON FORMULARY, every morning. D Mannose  Daily ( UTI symptoms), Disp: , Rfl:  .  Omega-3 1000 MG CAPS, Take 1 g by mouth daily. , Disp: , Rfl:  .  Probiotic Product (ULTRAFLORA IMMUNE HEALTH) 170 MG CAPS, Take 1 capsule by mouth., Disp: , Rfl:  .  risedronate (ACTONEL) 35 MG tablet, Take 1 tablet (35 mg total) by mouth every 7 (seven) days. with water on empty stomach, nothing by mouth or lie down for next 30 minutes., Disp: 4 tablet, Rfl: 9 .  UNABLE TO FIND, Similase, Disp: , Rfl:  .  UNABLE TO FIND, Ultra Hepa trope, Disp: , Rfl:  .  valACYclovir (VALTREX) 500 MG tablet, Take 500 mg by mouth as needed., Disp: , Rfl: :  :  Allergies  Allergen Reactions  . Macrobid [Nitrofurantoin] Nausea And Vomiting  :  Family History  Problem Relation Age of Onset  . Osteoporosis Mother   . Heart Problems Mother   . Thyroid disease Mother   . Cancer Father   . Ulcers Father   . Alcohol abuse Father   . Osteoporosis Maternal Grandmother   . Alzheimer's disease Maternal Grandfather   . Depression Daughter   . Psychiatric Illness Brother   :  Social History   Social History  . Marital  status: Divorced    Spouse name: N/A  . Number of children: N/A  . Years of education: N/A   Occupational History  . Not on file.   Social History Main Topics  . Smoking status: Former Smoker    Packs/day: 1.00    Years: 2.00    Types: Cigarettes    Quit date: 03/29/1999  . Smokeless tobacco: Never Used  . Alcohol use 1.2 oz/week    2 Glasses of wine per week  . Drug use: No  . Sexual activity: Yes    Birth control/ protection: Post-menopausal   Other Topics Concern  . Not on  file   Social History Narrative  . No narrative on file  :  Pertinent items are noted in HPI.  Exam: @IPVITALS @  well-developed and well-nourished white female in no obvious distress. Vital signs show a temperature of 97.8. Pulse 69. Blood pressure 144/84. Weight is 157 pounds. Head and neck exam shows no ocular or oral lesions. She has no palpable cervical or supraclavicular lymph nodes. Lungs are clear bilaterally. Cardiac exam regular rate and rhythm with no murmurs, rubs or bruits. Abdomen is soft. She has good bowel sounds. There is no fluid wave. There is no palpable liver or spleen tip. Back exam shows no tenderness over the spine, ribs or hips. Extremities shows no clubbing, cyanosis or edema. Skin exam shows very few areas of ecchymoses. Neurological exam shows no focal neurological deficits.    Recent Labs  05/25/16 1509  WBC 6.7  HGB 12.6  HCT 37.7  PLT 251   No results for input(s): NA, K, CL, CO2, GLUCOSE, BUN, CREATININE, CALCIUM in the last 72 hours.  Blood smear review:  Normochromic and normocytic by Place of red blood cells. There are no nucleated red blood cells. There are no teardrop cells. I see no rouleau formation. She has no target cells. White blood cells been normal in morphology maturation. There is no immature myeloid or lymphoid forms. Platelets are adequate number size. Platelets are well granulated.   Pathology: None     Assessment and Plan:  Ms. Gane is a very  nice 61 year old post menopausal white female. She has easy bruising. I really cannot see a lot of bruises on her skin when I examined her.  His heart assay whether or not she has von Willebrand's disease. She certainly has had some surgeries in the past, and having the was some teeth removed, without issues, would put into question by Willebrand's disease.  Her factor VIII levels are mildly depressed. She may be a factor VIII carrier. Again there is no history of bleeding in any men in the family.  She had a normal PTT when she was tested down in [place]. Again, she has had several surgeries without any bleeding.   I would be surprised if she has anything that would be clinically relevant for her von Willebrand's. She may have type IA von Willebrand's. We sent off a von Willebrand panel. We will see what it shows.  For now, we will have to await the results of the von Willebrand panel. I don't think that we have to get her back. If we do find that there is von Willebrand's disease, then I told her to let us know if she is going to have any surgery in the future and we can get her properly prepared so that bleeding risk would be minimal.  I spent about 45 minutes with her. She is very nice. She is very well educated.

## 2016-05-26 LAB — APTT: aPTT: 28 s (ref 24–33)

## 2016-05-27 LAB — VON WILLEBRAND PANEL
FACTOR VIII ACTIVITY: 95 % (ref 57–163)
VON WILLEBRAND AG: 90 % (ref 50–200)
VON WILLEBRAND FACTOR: 66 % (ref 50–200)

## 2016-06-01 NOTE — Addendum Note (Signed)
Addended by: Arlan OrganENNEVER, PETER R on: 06/01/2016 01:25 PM   Modules accepted: Orders

## 2016-06-03 ENCOUNTER — Ambulatory Visit: Payer: 59 | Admitting: Obstetrics & Gynecology

## 2016-06-08 NOTE — Progress Notes (Signed)
GYNECOLOGY  VISIT   HPI: 61 y.o. [id number] Divorced/remarried Caucasian female here for follow up after LEEP procedure.  Pt reports about 10 days after the procedure, she started to have some bright red but light bleeding.  Tapered off over the next week.  Denies bleeding or discharge.  Denies pain.  No bowel or bladder issues/concerns.    Pathology reviewed with pt.  Margins do appear negative but there may be one small area at the ectocervical margin with a small questionable area so will repeat pap in six months.  Pt comfortable with this.  Did see Dr. Myna Hidalgo recently.  He does not think she has VonWillibrand's disease.  Will have follow up testing in six months.  Marland Kitchen  GYNECOLOGIC HISTORY: Patient's last menstrual period was 02/17/2006. Contraception: PMP  Patient Active Problem List   Diagnosis Date Noted  . ASCUS with positive high risk HPV 04/16/2015  . Von Willebrand's disease (HCC) 01/20/2015  . Urinary frequency 09/24/2012  . Osteoporosis 02/04/2012  . Headache, chronic daily 02/04/2012  . Atrophic vaginitis 07/14/2008  . Subclinical thyrotoxicosis 07/30/2007  . Headache 05/24/2006    Past Medical History:  Diagnosis Date  . Abnormal glandular Papanicolaou smear of cervix 06/2005   followed by LEEP, + HR HPV DNA 06/2008  . Allergy   . Anemia 09/2004   iron deficient  . Anxiety   . Broken clavicle 10/23/2015   pt states she fell in bathroom   . Depression    Last psychiatrist - Dr. Melrose Nakayama - does not have records as seen prior to 2007  . Foot fracture, left 11/2006   5th MTP  . Migraine   . Osteoporosis 02/2006   DEXA 04/23/12 w/ T score L femur -2.8 and L spine -1.4  . Thyroid disease 01/2007   subclinical hyperthyroidism  . Von Willebrand's disease (HCC) 03/2005    Past Surgical History:  Procedure Laterality Date  . COLONOSCOPY N/A 10/15/2012   Procedure: COLONOSCOPY;  Surgeon: Charna Elizabeth, MD;  Location: [facility];  Service: Endoscopy;  Laterality: N/A;  .  COLPORRHAPHY  01/27/15   Posterior- [facility], Dr. Gala Lewandowsky  . LEEP  2007  . LEEP N/A 05/06/2016   Procedure: LOOP ELECTROSURGICAL EXCISION PROCEDURE (LEEP) with colposcopy ;  Surgeon: Jerene Bears, MD;  Location: [facility];  Service: Gynecology;  Laterality: N/A;  . TMJ ARTHROSCOPY  1996  . TONSILLECTOMY AND ADENOIDECTOMY      MEDS:  Reviewed in EPIC and UTD  ALLERGIES: Macrobid [nitrofurantoin]  Family History  Problem Relation Age of Onset  . Osteoporosis Mother   . Heart Problems Mother   . Thyroid disease Mother   . Cancer Father   . Ulcers Father   . Alcohol abuse Father   . Osteoporosis Maternal Grandmother   . Alzheimer's disease Maternal Grandfather   . Depression Daughter   . Psychiatric Illness Brother     SH:  Married, non smoker  Review of Systems  All other systems reviewed and are negative.   PHYSICAL EXAMINATION:    BP 118/64 (BP Location: Right Arm, Patient Position: Sitting, Cuff Size: Normal)   Pulse 76   Resp 16   Wt 153 lb (69.4 kg)   LMP 02/17/2006   BMI 25.86 kg/m     General appearance: alert, cooperative and appears stated age  Pelvic: External genitalia:  no lesions              Urethra:  normal appearing urethra with no masses,  tenderness or lesions              Bartholins and Skenes: normal                 Vagina: normal appearing vagina with normal color and discharge, no lesions              Cervix: no lesions and healing well              Bimanual Exam:  Uterus:  normal size, contour, position, consistency, mobility, non-tender             Chaperone was present for exam.  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  Then plan pap and HR HPV testing 1 year if pap is LGSIL or less at six months.  All questions answered.

## 2016-06-09 ENCOUNTER — Ambulatory Visit (INDEPENDENT_AMBULATORY_CARE_PROVIDER_SITE_OTHER): Payer: 59 | Admitting: Obstetrics & Gynecology

## 2016-06-09 ENCOUNTER — Encounter: Payer: Self-pay | Admitting: Obstetrics & Gynecology

## 2016-06-09 VITALS — BP 118/64 | HR 76 | Resp 16 | Wt 153.0 lb

## 2016-06-09 DIAGNOSIS — R87613 High grade squamous intraepithelial lesion on cytologic smear of cervix (HGSIL): Secondary | ICD-10-CM | POA: Diagnosis not present

## 2016-06-10 ENCOUNTER — Ambulatory Visit: Payer: 59 | Admitting: Obstetrics & Gynecology

## 2016-06-15 ENCOUNTER — Other Ambulatory Visit: Payer: Self-pay | Admitting: Obstetrics & Gynecology

## 2016-06-15 MED ORDER — HYDROCORTISONE ACE-PRAMOXINE 2.5-1 % RE CREA
TOPICAL_CREAM | Freq: Three times a day (TID) | RECTAL | 0 refills | Status: AC
Start: 2016-06-15 — End: ?

## 2016-06-15 NOTE — Telephone Encounter (Signed)
Patient is asking for a new prescription of hydrocortisone-pramoxine (ANALPRAM-HC) 2.5-1 % rectal cream  to be called to [facility], [address]

## 2016-06-15 NOTE — Telephone Encounter (Signed)
I do not see that Dr. Hyacinth MeekerMiller is the prescriber for this medication.  Please contact the patient in follow up to this.   Cc- Summer O'Neal

## 2016-06-15 NOTE — Telephone Encounter (Signed)
Spoke with patient. Patient states she was prescribed rectal cream by previous OBGYN, Dr. Stefano GaulStringer, for hemorrhoids about 5 years ago. Patient states she does not use medication often, only when hemorrhoids flare up and get swollen. Patient states she like to keep it on hand. Advised patient Dr. Hyacinth MeekerMiller if out of the office, would review with covering provider for recommendations and return call, patient is agreeable. Patient states leave a detailed message on home number if no answer.  Cc: Dr. Hyacinth MeekerMiller

## 2016-06-15 NOTE — Telephone Encounter (Signed)
Medication refill request: Hydrocortisone-Pramoxine rectal cream Last AEX:  03/17/16 SM Next AEX: 04/03/17 SM Last MMG (if hormonal medication request): 05/02/16 BIRADS1, Density B, TBC Refill authorized: 12/05/13 Historical  Routing to BS since SM is out of the office.

## 2016-06-15 NOTE — Telephone Encounter (Signed)
Spoke with patient, advised refill sent to pharmacy on file. Patient thankful for refill and return call.  Routing to provider for final review. Patient is agreeable to disposition. Will close encounter.

## 2016-06-27 ENCOUNTER — Other Ambulatory Visit: Payer: Self-pay | Admitting: Obstetrics & Gynecology

## 2016-06-28 NOTE — Telephone Encounter (Signed)
Medication refill request: Actonel   Last AEX:  03-17-16  Next AEX: 04-03-17  Last MMG (if hormonal medication request): 05-03-16 WNL  Refill authorized: please advise

## 2016-06-30 ENCOUNTER — Other Ambulatory Visit: Payer: Self-pay | Admitting: Family Medicine

## 2016-06-30 DIAGNOSIS — R51 Headache: Principal | ICD-10-CM

## 2016-06-30 DIAGNOSIS — R519 Headache, unspecified: Secondary | ICD-10-CM

## 2016-07-08 ENCOUNTER — Ambulatory Visit: Payer: 59 | Admitting: Obstetrics & Gynecology

## 2016-07-10 ENCOUNTER — Inpatient Hospital Stay
Admission: RE | Admit: 2016-07-10 | Discharge: 2016-07-10 | Disposition: A | Discharge status: Home / Self Care | Payer: 59 | Source: Ambulatory Visit | Attending: Family Medicine | Admitting: Family Medicine

## 2016-07-21 ENCOUNTER — Ambulatory Visit
Admission: RE | Admit: 2016-07-21 | Discharge: 2016-07-21 | Disposition: A | Discharge status: Home / Self Care | Payer: 59 | Source: Ambulatory Visit | Attending: Family Medicine | Admitting: Family Medicine

## 2016-07-21 DIAGNOSIS — R51 Headache: Principal | ICD-10-CM

## 2016-07-21 DIAGNOSIS — R519 Headache, unspecified: Secondary | ICD-10-CM

## 2016-07-21 MED ORDER — GADOBENATE DIMEGLUMINE 529 MG/ML IV SOLN
15.0000 mL | Freq: Once | INTRAVENOUS | Status: AC | PRN
  Administered 2016-07-21: 15 mL via INTRAVENOUS

## 2016-08-18 ENCOUNTER — Emergency Department (HOSPITAL_COMMUNITY): Payer: 59

## 2016-08-18 ENCOUNTER — Emergency Department (HOSPITAL_COMMUNITY)
Admission: EM | Admit: 2016-08-18 | Discharge: 2016-08-18 | Disposition: A | Discharge status: Home / Self Care | Payer: 59 | Attending: Emergency Medicine | Admitting: Emergency Medicine

## 2016-08-18 ENCOUNTER — Encounter (HOSPITAL_COMMUNITY): Payer: Self-pay | Admitting: Emergency Medicine

## 2016-08-18 DIAGNOSIS — Z7982 Long term (current) use of aspirin: Secondary | ICD-10-CM | POA: Insufficient documentation

## 2016-08-18 DIAGNOSIS — R791 Abnormal coagulation profile: Secondary | ICD-10-CM | POA: Diagnosis not present

## 2016-08-18 DIAGNOSIS — R51 Headache: Secondary | ICD-10-CM | POA: Diagnosis not present

## 2016-08-18 DIAGNOSIS — Z79899 Other long term (current) drug therapy: Secondary | ICD-10-CM | POA: Diagnosis not present

## 2016-08-18 DIAGNOSIS — R569 Unspecified convulsions: Secondary | ICD-10-CM | POA: Diagnosis not present

## 2016-08-18 DIAGNOSIS — N12 Tubulo-interstitial nephritis, not specified as acute or chronic: Secondary | ICD-10-CM | POA: Diagnosis not present

## 2016-08-18 DIAGNOSIS — R519 Headache, unspecified: Secondary | ICD-10-CM

## 2016-08-18 DIAGNOSIS — Z87891 Personal history of nicotine dependence: Secondary | ICD-10-CM | POA: Diagnosis not present

## 2016-08-18 DIAGNOSIS — R509 Fever, unspecified: Secondary | ICD-10-CM

## 2016-08-18 LAB — I-STAT CG4 LACTIC ACID, ED: LACTIC ACID, VENOUS: 1.03 mmol/L (ref 0.5–1.9)

## 2016-08-18 LAB — CBC WITH DIFFERENTIAL/PLATELET
Basophils Absolute: 0 10*3/uL (ref 0.0–0.1)
Basophils Relative: 0 %
EOS ABS: 0 10*3/uL (ref 0.0–0.7)
EOS PCT: 0 %
HCT: 37.6 % (ref 36.0–46.0)
Hemoglobin: 12.2 g/dL (ref 12.0–15.0)
LYMPHS ABS: 0.8 10*3/uL (ref 0.7–4.0)
Lymphocytes Relative: 12 %
MCH: 28.8 pg (ref 26.0–34.0)
MCHC: 32.4 g/dL (ref 30.0–36.0)
MCV: 88.7 fL (ref 78.0–100.0)
MONOS PCT: 2 %
Monocytes Absolute: 0.2 10*3/uL (ref 0.1–1.0)
Neutro Abs: 5.6 10*3/uL (ref 1.7–7.7)
Neutrophils Relative %: 86 %
PLATELETS: 242 10*3/uL (ref 150–400)
RBC: 4.24 MIL/uL (ref 3.87–5.11)
RDW: 14.8 % (ref 11.5–15.5)
WBC: 6.6 10*3/uL (ref 4.0–10.5)

## 2016-08-18 LAB — URINALYSIS, ROUTINE W REFLEX MICROSCOPIC
Bilirubin Urine: NEGATIVE
GLUCOSE, UA: NEGATIVE mg/dL
Hgb urine dipstick: NEGATIVE
KETONES UR: NEGATIVE mg/dL
Leukocytes, UA: NEGATIVE
Nitrite: POSITIVE — AB
PROTEIN: NEGATIVE mg/dL
SQUAMOUS EPITHELIAL / LPF: NONE SEEN
Specific Gravity, Urine: 1.041 — ABNORMAL HIGH (ref 1.005–1.030)
pH: 7 (ref 5.0–8.0)

## 2016-08-18 LAB — BASIC METABOLIC PANEL
Anion gap: 9 (ref 5–15)
BUN: 17 mg/dL (ref 6–20)
CHLORIDE: 105 mmol/L (ref 101–111)
CO2: 24 mmol/L (ref 22–32)
CREATININE: 0.9 mg/dL (ref 0.44–1.00)
Calcium: 9.7 mg/dL (ref 8.9–10.3)
GFR calc Af Amer: >60 mL/min (ref >60)
GFR calc non Af Amer: >60 mL/min (ref >60)
Glucose, Bld: 128 mg/dL — ABNORMAL HIGH (ref 65–99)
Potassium: 3.9 mmol/L (ref 3.5–5.1)
SODIUM: 138 mmol/L (ref 135–145)

## 2016-08-18 LAB — PROTIME-INR
INR: 1.02
Prothrombin Time: 13.4 seconds (ref 11.4–15.2)

## 2016-08-18 LAB — INFLUENZA PANEL BY PCR (TYPE A & B)
Influenza A By PCR: NEGATIVE
Influenza B By PCR: NEGATIVE

## 2016-08-18 LAB — APTT: APTT: 25 s (ref 24–36)

## 2016-08-18 LAB — I-STAT CREATININE, ED: Creatinine, Ser: 0.9 mg/dL (ref 0.44–1.00)

## 2016-08-18 MED ORDER — IOPAMIDOL (ISOVUE-370) INJECTION 76%
100.0000 mL | Freq: Once | INTRAVENOUS | Status: AC | PRN
  Administered 2016-08-18: 100 mL via INTRAVENOUS

## 2016-08-18 MED ORDER — IOPAMIDOL (ISOVUE-370) INJECTION 76%
INTRAVENOUS | Status: AC
  Administered 2016-08-18: 100 mL via INTRAVENOUS
  Filled 2016-08-18: qty 100

## 2016-08-18 MED ORDER — DEXTROSE 5 % IV SOLN
1.0000 g | Freq: Once | INTRAVENOUS | Status: AC
  Administered 2016-08-18: 1 g via INTRAVENOUS
  Filled 2016-08-18: qty 10

## 2016-08-18 MED ORDER — CEPHALEXIN 500 MG PO CAPS: 500.0000 mg | ORAL_CAPSULE | Freq: Three times a day (TID) | ORAL | 0 refills | Status: AC

## 2016-08-18 MED ORDER — LIDOCAINE-EPINEPHRINE (PF) 2 %-1:200000 IJ SOLN
10.0000 mL | Freq: Once | INTRAMUSCULAR | Status: DC
  Filled 2016-08-18: qty 20

## 2016-08-18 MED ORDER — MORPHINE SULFATE (PF) 2 MG/ML IV SOLN
4.0000 mg | Freq: Once | INTRAVENOUS | Status: AC
  Administered 2016-08-18: 4 mg via INTRAVENOUS
  Filled 2016-08-18: qty 2

## 2016-08-18 MED ORDER — SODIUM CHLORIDE 0.9 % IV BOLUS (SEPSIS)
1000.0000 mL | Freq: Once | INTRAVENOUS | Status: AC
  Administered 2016-08-18: 1000 mL via INTRAVENOUS

## 2016-08-18 MED ORDER — DIPHENHYDRAMINE HCL 50 MG/ML IJ SOLN
25.0000 mg | Freq: Once | INTRAMUSCULAR | Status: AC
  Administered 2016-08-18: 25 mg via INTRAVENOUS
  Filled 2016-08-18: qty 1

## 2016-08-18 MED ORDER — METOCLOPRAMIDE HCL 5 MG/ML IJ SOLN
10.0000 mg | Freq: Once | INTRAMUSCULAR | Status: AC
  Administered 2016-08-18: 10 mg via INTRAVENOUS
  Filled 2016-08-18: qty 2

## 2016-08-18 MED ORDER — ACETAMINOPHEN 325 MG PO TABS
650.0000 mg | ORAL_TABLET | Freq: Once | ORAL | Status: AC
  Administered 2016-08-18: 650 mg via ORAL
  Filled 2016-08-18: qty 2

## 2016-08-18 NOTE — ED Provider Notes (Signed)
[facility]-EMERGENCY DEPT Provider Note   CSN: [id number] Arrival date & time: 08/18/16  0845     History   Chief Complaint Chief Complaint  Patient presents with  . Headache  . Generalized Body Aches    HPI Haley Buchanan is a 61 y.o. female.  HPI  61 year old female with a history of chronic headaches and migraines presents with a worsening headache. Typically she gets 2 headaches per week which typically go away with Excedrin. She tries not to take Excedrin more than a couple times a week because she is worried about rebound headaches. 2 days ago she developed what was a typical headache. She took Excedrin. Yesterday another typical headache came which was a little bit abnormal for her to come so close to the other one. She took Excedrin again. Last night however the headache seemed to be worsening and a little bit different. The headache is frontal which is typical for her but also has an occipital and neck/trapezius component. Throughout the night it seemed to get worse. When she got up in the middle the night to urinate it was worse than when she went to bed and now is worse than that point. There was never a thunderclap component. She has not noticed any fevers. She's been nauseated. She also feels diffuse body aches. She was seen at urgent care and referred here due to blood pressures in the 80s and 90s. She states she things her normal blood pressures about 110/80. She's had intermittent loose stools for a month but nothing new today. No abdominal pain or chest pain. Mild intermittent cough but no urinary symptoms. No neck stiffness. Any type of movement seems to worsen her headache. No blurry vision. No weakness/numbness. No photophobia.  She carries a diagnoses of von Willebrand disease. She states she's had this for at least 10 years. She tells everyone before she has surgery, but recently saw a hematologist he's not sure she really has this. She is due for more testing. Bruises  easily  Past Medical History:  Diagnosis Date  . Abnormal glandular Papanicolaou smear of cervix 06/2005   followed by LEEP, + HR HPV DNA 06/2008  . Allergy   . Anemia 09/2004   iron deficient  . Anxiety   . Broken clavicle 10/23/2015   pt states she fell in bathroom   . Depression    Last psychiatrist - Dr. Melrose NakayamaPascal - does not have records as seen prior to 2007  . Foot fracture, left 11/2006   5th MTP  . Migraine   . Osteoporosis 02/2006   DEXA 04/23/12 w/ T score L femur -2.8 and L spine -1.4  . Thyroid disease 01/2007   subclinical hyperthyroidism  . Von Willebrand's disease [facility](HCC) 03/2005    Patient Active Problem List   Diagnosis Date Noted  . ASCUS with positive high risk HPV 04/16/2015  . Von Willebrand's disease (HCC) 01/20/2015  . Urinary frequency 09/24/2012  . Osteoporosis 02/04/2012  . Headache, chronic daily 02/04/2012  . Atrophic vaginitis 07/14/2008  . Subclinical thyrotoxicosis 07/30/2007  . Headache 05/24/2006    Past Surgical History:  Procedure Laterality Date  . COLONOSCOPY N/A 10/15/2012   Procedure: COLONOSCOPY;  Surgeon: Charna ElizabethJyothi Mann, MD;  Location: [facility];  Service: Endoscopy;  Laterality: N/A;  . COLPORRHAPHY  01/27/15   Posterior- [place], Dr. Gala LewandowskyBadlani  . LEEP  2007  . LEEP N/A 05/06/2016   Procedure: LOOP ELECTROSURGICAL EXCISION PROCEDURE (LEEP) with colposcopy ;  Surgeon: Jerene BearsMary S Miller, MD;  Location: [facility];  Service: Gynecology;  Laterality: N/A;  . TMJ ARTHROSCOPY  1996  . TONSILLECTOMY AND ADENOIDECTOMY      OB History    Gravida Para Term Preterm AB Living   5 2 2   3 2    SAB TAB Ectopic Multiple Live Births   3               Home Medications    Prior to Admission medications   Medication Sig Start Date End Date Taking? Authorizing Provider  ALPRAZolam (XANAX) 0.25 MG tablet Take 1 tablet (0.25 mg total) by mouth at bedtime as needed for anxiety. 03/17/16  Yes Jerene Bears, MD   aspirin-acetaminophen-caffeine (EXCEDRIN MIGRAINE) [phone] MG tablet Take 1 tablet by mouth every 8 (eight) hours as needed for headache or migraine.    Yes [provider]  Calcium-Vitamin D 600-200 MG-UNIT tablet Take 2 tablets by mouth daily.    Yes [provider]  Cholecalciferol (VITAMIN D3) 5000 units CAPS Take 5,000 Units by mouth once a week.   Yes [provider]  FLUoxetine (PROZAC) 40 MG capsule Take 1 capsule (40 mg total) by mouth daily. 03/17/16  Yes Jerene Bears, MD  glucosamine-chondroitin (GLUCOSAMINE-CHONDROITIN DS) 500-400 MG tablet Take 1 tablet by mouth 2 times daily at 12 noon and 4 pm.    Yes [provider]  hydrocortisone-pramoxine [facility]) 2.5-1 % rectal cream Place rectally 3 (three) times daily. Apply as needed. 06/15/16  Yes Patton Salles, MD  Magnesium Oxide 400 MG CAPS Take 2 capsules by mouth daily.    Yes [provider]  Multiple Vitamin tablet Take 1 tablet by mouth daily.  10/29/04  Yes [provider]  NON FORMULARY every morning. D [place]  Daily ( UTI symptoms)   Yes [provider]  Omega-3 1000 MG CAPS Take 1 g by mouth daily.    Yes [provider]  Probiotic Product (ULTRAFLORA IMMUNE HEALTH) 170 MG CAPS Take 1 capsule by mouth.   Yes [provider]  risedronate (ACTONEL) 35 MG tablet TAKE 1 TABLET BY MOUTH EVERY 7 DAYS WITH WATER ON AN EMPTY S 06/29/16  Yes Jerene Bears, MD  UNABLE TO FIND Similase   Yes [provider]  UNABLE TO FIND Ultra Hepa trope   Yes [provider]  cephALEXin (KEFLEX) 500 MG capsule Take 1 capsule (500 mg total) by mouth 3 (three) times daily. 08/18/16 08/28/16  Pricilla Loveless, MD  conjugated estrogens (PREMARIN) vaginal cream Apply pea-sized amount topically to urethra and inner labia daily as directed.  Do not apply intravaginally for two weeks post op. Patient not taking: Reported on 08/18/2016 05/06/16    Jerene Bears, MD  HYDROcodone-acetaminophen (NORCO/VICODIN) 5-325 MG tablet Take 1-2 tablets by mouth every 4 (four) hours as needed. Patient not taking: Reported on 08/18/2016 05/06/16   Jerene Bears, MD    Family History Family History  Problem Relation Age of Onset  . Osteoporosis Mother   . Heart Problems Mother   . Thyroid disease Mother   . Cancer Father   . Ulcers Father   . Alcohol abuse Father   . Osteoporosis Maternal Grandmother   . Alzheimer's disease Maternal Grandfather   . Depression Daughter   . Psychiatric Illness Brother     Social History Social History  Substance Use Topics  . Smoking status: Former Smoker    Packs/day: 1.00    Years: 2.00  Types: Cigarettes    Quit date: 03/29/1999  . Smokeless tobacco: Never Used  . Alcohol use 1.2 oz/week    2 Glasses of wine per week     Allergies   Sulfamethoxazole-trimethoprim and Macrobid [nitrofurantoin]   Review of Systems Review of Systems  Constitutional: Negative for fever.  Eyes: Negative for pain and visual disturbance.  Respiratory: Negative for shortness of breath.   Cardiovascular: Negative for chest pain.  Gastrointestinal: Positive for nausea. Negative for abdominal pain and vomiting.  Genitourinary: Negative for dysuria.  Musculoskeletal: Positive for neck pain. Negative for neck stiffness.  Neurological: Positive for dizziness and headaches. Negative for weakness and numbness.  Hematological: Bruises/bleeds easily.  All other systems reviewed and are negative.    Physical Exam Updated Vital Signs BP 114/66 (BP Location: Right Arm)   Pulse 82   Temp 99.4 F (37.4 C)   Resp 17   LMP 02/17/2006   SpO2 95%   Physical Exam  Constitutional: She is oriented to person, place, and time. She appears well-developed and well-nourished. No distress.  HENT:  Head: Normocephalic and atraumatic.  Right Ear: External ear normal.  Left Ear: External ear normal.  Nose: Nose normal.  Eyes:  EOM are normal. Pupils are equal, round, and reactive to light. Right eye exhibits no discharge. Left eye exhibits no discharge.  No photophobia  Neck: Normal range of motion. Neck supple.  No focal neck tenderness  Cardiovascular: Normal rate, regular rhythm and normal heart sounds.   Pulmonary/Chest: Effort normal and breath sounds normal.  Abdominal: Soft. There is no tenderness.  Neurological: She is alert and oriented to person, place, and time.  CN 3-12 grossly intact. 5/5 strength in all 4 extremities. Grossly normal sensation. Normal finger to nose.   Skin: Skin is warm and dry. She is not diaphoretic.  Nursing note and vitals reviewed.    ED Treatments / Results  Labs (all labs ordered are listed, but only abnormal results are displayed) Labs Reviewed  BASIC METABOLIC PANEL - Abnormal; Notable for the following:       Result Value   Glucose, Bld 128 (*)    All other components within normal limits  URINALYSIS, ROUTINE W REFLEX MICROSCOPIC - Abnormal; Notable for the following:    APPearance HAZY (*)    Specific Gravity, Urine 1.041 (*)    Nitrite POSITIVE (*)    Bacteria, UA MANY (*)    All other components within normal limits  URINE CULTURE  CBC WITH DIFFERENTIAL/PLATELET  INFLUENZA PANEL BY PCR (TYPE A & B)  APTT  PROTIME-INR  I-STAT CG4 LACTIC ACID, ED  I-STAT CREATININE, ED    EKG  EKG Interpretation None       Radiology Ct Angio Head W Or Wo Contrast  Result Date: 08/18/2016 CLINICAL DATA:  Severe headache radiating to neck and shoulders since last night. Generalized body aches and nausea. Low blood pressure. History of migraines. EXAM: CT ANGIOGRAPHY HEAD AND NECK TECHNIQUE: Multidetector CT imaging of the head and neck was performed using the standard protocol during bolus administration of intravenous contrast. Multiplanar CT image reconstructions and MIPs were obtained to evaluate the vascular anatomy. Carotid stenosis measurements (when applicable)  are obtained utilizing NASCET criteria, using the distal internal carotid diameter as the denominator. CONTRAST:  CONTRAST 133 mL Isovue 370. The original bolus of 33 mL was terminated due to pain, and subsequent follow-up with 100 mL contrast without difficulty. There was no visible extravasation. COMPARISON:  MR brain 07/21/2016.  FINDINGS: CT HEAD Calvarium and skull base: No fracture or destructive lesion. Mastoids and middle ears are grossly clear. Paranasal sinuses: Imaged portions are clear. Orbits: Negative. Brain: No evidence of acute abnormality, including acute infarct, hemorrhage, hydrocephalus, or mass lesion. Normal cerebral volume. No white matter disease is evident. No subarachnoid hemorrhage is observed. CTA NECK Aortic arch: Standard branching. Imaged portion shows no evidence of aneurysm or dissection. No significant stenosis of the major arch vessel origins. Right carotid system: Minor calcific atheromatous change at the bifurcation. No evidence of dissection, stenosis (50% or greater) or occlusion. Left carotid system: Minor calcific atheromatous change the bifurcation. No evidence of dissection, stenosis (50% or greater) or occlusion. Vertebral arteries: Codominant. No evidence of dissection, stenosis (50% or greater) or occlusion. No neck masses. Scattered subcentimeter thyroid nodules, do not meet consensus for biopsy. Mild vascular congestion in the dependent lung fields, but no pneumothorax or frank edema. No adenopathy. Cervical spondylosis, most pronounced from C4 through C7. CTA HEAD Anterior circulation: The skull base and supraclinoid internal carotid arteries are dolichoectatic but widely patent. No proximal abnormality of the anterior or middle cerebral arteries. No significant branch stenosis, aneurysm, or vascular malformation. 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 No significant stenosis, proximal occlusion, aneurysm, or  vascular malformation. Venous sinuses: As permitted by contrast timing, patent. Anatomic variants: BILATERAL fetal PCA origins. Delayed phase:   No abnormal intracranial enhancement. Review of the MIP images confirms the above findings. Compared with prior MR, good general agreement. IMPRESSION: No extracranial or intracranial stenosis or dissection. No acute intracranial findings, or abnormal postcontrast enhancement. 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 Electronically Signed   By: Elsie Stain M.D.   On: 08/18/2016 11:19   Dg Chest 2 View  Result Date: 08/18/2016 CLINICAL DATA:  60 year old female with a history of body aches and nausea EXAM: CHEST  2 VIEW COMPARISON:  No prior for comparison FINDINGS: Cardiomediastinal silhouette within normal limits. No evidence of central vascular congestion. Coarsened interstitial markings with low lung volumes. No confluent airspace disease. No pneumothorax or pleural effusion. Posttraumatic/ lytic changes of the distal right clavicle. No displaced fracture. Degenerative changes of the spine. IMPRESSION: No evidence of lobar pneumonia, with likely chronic lung changes. Posttraumatic or lytic changes of the distal right clavicle. Correlation with history of prior trauma or rheumatologic disorder recommended. Electronically Signed   By: Gilmer Mor D.O.   On: 08/18/2016 11:26   Ct Angio Neck W And/or Wo Contrast  Result Date: 08/18/2016 CLINICAL DATA:  Severe headache radiating to neck and shoulders since last night. Generalized body aches and nausea. Low blood pressure. History of migraines. EXAM: CT ANGIOGRAPHY HEAD AND NECK TECHNIQUE: Multidetector CT imaging of the head and neck was performed using the standard protocol during bolus administration of intravenous contrast. Multiplanar CT image reconstructions and MIPs were obtained to evaluate the vascular anatomy. Carotid stenosis measurements (when applicable) are obtained utilizing  NASCET criteria, using the distal internal carotid diameter as the denominator. CONTRAST:  CONTRAST 133 mL Isovue 370. The original bolus of 33 mL was terminated due to pain, and subsequent follow-up with 100 mL contrast without difficulty. There was no visible extravasation. COMPARISON:  MR brain 07/21/2016. FINDINGS: CT HEAD Calvarium and skull base: No fracture or destructive lesion. Mastoids and middle ears are grossly clear. Paranasal sinuses: Imaged portions are clear. Orbits: Negative. Brain: No evidence of acute abnormality, including acute infarct, hemorrhage, hydrocephalus, or mass lesion.  Normal cerebral volume. No white matter disease is evident. No subarachnoid hemorrhage is observed. CTA NECK Aortic arch: Standard branching. Imaged portion shows no evidence of aneurysm or dissection. No significant stenosis of the major arch vessel origins. Right carotid system: Minor calcific atheromatous change at the bifurcation. No evidence of dissection, stenosis (50% or greater) or occlusion. Left carotid system: Minor calcific atheromatous change the bifurcation. No evidence of dissection, stenosis (50% or greater) or occlusion. Vertebral arteries: Codominant. No evidence of dissection, stenosis (50% or greater) or occlusion. No neck masses. Scattered subcentimeter thyroid nodules, do not meet consensus for biopsy. Mild vascular congestion in the dependent lung fields, but no pneumothorax or frank edema. No adenopathy. Cervical spondylosis, most pronounced from C4 through C7. CTA HEAD Anterior circulation: The skull base and supraclinoid internal carotid arteries are dolichoectatic but widely patent. No proximal abnormality of the anterior or middle cerebral arteries. No significant branch stenosis, aneurysm, or vascular malformation. 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 No significant stenosis, proximal occlusion, aneurysm, or vascular malformation.  Venous sinuses: As permitted by contrast timing, patent. Anatomic variants: BILATERAL fetal PCA origins. Delayed phase:   No abnormal intracranial enhancement. Review of the MIP images confirms the above findings. Compared with prior MR, good general agreement. IMPRESSION: No extracranial or intracranial stenosis or dissection. No acute intracranial findings, or abnormal postcontrast enhancement. 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 Electronically Signed   By: Elsie Stain M.D.   On: 08/18/2016 11:19    Procedures Procedures (including critical care time)  Medications Ordered in ED Medications  sodium chloride 0.9 % bolus 1,000 mL (0 mLs Intravenous Stopped 08/18/16 1030)  metoCLOPramide (REGLAN) injection 10 mg (10 mg Intravenous Given 08/18/16 0948)  diphenhydrAMINE (BENADRYL) injection 25 mg (25 mg Intravenous Given 08/18/16 0948)  iopamidol (ISOVUE-370) 76 % injection 100 mL (100 mLs Intravenous Contrast Given 08/18/16 1013)  iopamidol (ISOVUE-370) 76 % injection 100 mL (100 mLs Intravenous Contrast Given 08/18/16 1053)  acetaminophen (TYLENOL) tablet 650 mg (650 mg Oral Given 08/18/16 1140)  morphine 2 MG/ML injection 4 mg (4 mg Intravenous Given 08/18/16 1308)  cefTRIAXone (ROCEPHIN) 1 g in dextrose 5 % 50 mL IVPB (0 g Intravenous Stopped 08/18/16 1408)     Initial Impression / Assessment and Plan / ED Course  I have reviewed the triage vital signs and the nursing notes.  Pertinent labs & imaging results that were available during my care of the patient were reviewed by me and considered in my medical decision making (see chart for details).  Clinical Course as of Aug 18 2098  Thu Aug 18, 2016  1137 D/w Dr. Mosetta Putt (hem/onc). She spoke to Dr. Myna Hidalgo. Recent von willebrand testing negative. Recheck ptt today, if negative, then ok to proceed with LP per hematology. However there is no meningismus or signs of AMS or severe disease. I think meningitis is possible but I  don't feel so strongly that she needs IV antibiotics now, will await LP results  [SG]    Clinical Course User Index [SG] Pricilla Loveless, MD    Given headache/fever (rectal temp 101), planned for LP. However after multiple discussions with patient and husband, they decline after informed decision making. I offered in spite of what appears to be a positive urine. They prefer UTI treatment and d/c with close PCP f/u tomorrow. She does not have meningismus, AMS, or focal neuro deficits. I dicussed possible consequences of missed meningitis such as death,  permanent neuro disability, etc. They understand. They also understand strict return precautions. Of not her pressure was soft but not hypotensive here. Lactic acid is normal, WBC normal, no end organ dysfunction. They want to go home instead of obs with fluids and monitoring. D/c with treatment for pyelo, strict return precautions.   Final Clinical Impressions(s) / ED Diagnoses   Final diagnoses:  Febrile illness  Pyelonephritis  Bad headache    New Prescriptions Discharge Medication List as of 08/18/2016  2:04 PM    START taking these medications   Details  cephALEXin (KEFLEX) 500 MG capsule Take 1 capsule (500 mg total) by mouth 3 (three) times daily., Starting Thu 08/18/2016, Until Sun 08/28/2016, Print         Pricilla Loveless, MD 08/18/16 2103

## 2016-08-18 NOTE — ED Triage Notes (Signed)
Pt c/o severe headache that radiates to neck and shoulders since last night. Pt also c/o generalized body aches and nausea. Pt was seen at urgent care today, bp in UC 85/49, bp in triage 98/63. Pt with hx of migraines but states her headache today feels different than migraines.

## 2016-08-18 NOTE — ED Notes (Signed)
Pt refused BP due to tightness. Nurse aware.

## 2016-08-18 NOTE — ED Notes (Signed)
Off floor for testing 

## 2016-08-20 LAB — URINE CULTURE

## 2016-08-21 ENCOUNTER — Telehealth: Payer: Self-pay

## 2016-08-21 NOTE — Telephone Encounter (Signed)
Post ED Visit - Positive Culture Follow-up  Culture report reviewed by antimicrobial stewardship pharmacist:  []  Haley Buchanan, Pharm.D. []  Haley Buchanan, Pharm.D., BCPS AQ-ID []  Haley Buchanan, Pharm.D., BCPS []  Haley Buchanan, Pharm.D., BCPS []  Haley Buchanan, [address]Pharm[address]D., BCPS, AAHIVP []  Haley Buchanan, Pharm.D., BCPS, AAHIVP [x]  Haley Buchanan, PharmD, BCPS []  Haley Buchanan, PharmD, BCPS []  Haley Buchanan, PharmD, BCPS  Positive urine culture Treated with Cephalexin, organism sensitive to the same and no further patient follow-up is required at this time.  Haley Buchanan, Haley Buchanan 08/21/2016, 9:37 AM

## 2016-08-26 NOTE — Addendum Note (Signed)
Addendum  created 08/26/16 1116 by Alexias Margerum, MD   Sign clinical note    

## 2016-11-30 ENCOUNTER — Other Ambulatory Visit: Payer: 59

## 2016-12-13 ENCOUNTER — Other Ambulatory Visit: Payer: Self-pay | Admitting: *Deleted

## 2016-12-13 DIAGNOSIS — D6800 Von Willebrand disease, unspecified: Secondary | ICD-10-CM

## 2016-12-14 ENCOUNTER — Other Ambulatory Visit (HOSPITAL_BASED_OUTPATIENT_CLINIC_OR_DEPARTMENT_OTHER): Payer: 59

## 2016-12-14 DIAGNOSIS — D6800 Von Willebrand disease, unspecified: Secondary | ICD-10-CM

## 2016-12-14 LAB — CBC WITH DIFFERENTIAL (CANCER CENTER ONLY)
BASO#: 0 10*3/uL (ref 0.0–0.2)
BASO%: 0.6 % (ref 0.0–2.0)
EOS ABS: 0.1 10*3/uL (ref 0.0–0.5)
EOS%: 1.9 % (ref 0.0–7.0)
HCT: 39 % (ref 34.8–46.6)
HGB: 12.7 g/dL (ref 11.6–15.9)
LYMPH#: 1.8 10*3/uL (ref 0.9–3.3)
LYMPH%: 37.1 % (ref 14.0–48.0)
MCH: 29.5 pg (ref 26.0–34.0)
MCHC: 32.6 g/dL (ref 32.0–36.0)
MCV: 91 fL (ref 81–101)
MONO#: 0.3 10*3/uL (ref 0.1–0.9)
MONO%: 6.5 % (ref 0.0–13.0)
NEUT#: 2.6 10*3/uL (ref 1.5–6.5)
NEUT%: 53.9 % (ref 39.6–80.0)
PLATELETS: 235 10*3/uL (ref 145–400)
RBC: 4.31 10*6/uL (ref 3.70–5.32)
RDW: 14.1 % (ref 11.1–15.7)
WBC: 4.7 10*3/uL (ref 3.9–10.0)

## 2016-12-15 ENCOUNTER — Ambulatory Visit: Payer: Self-pay | Admitting: Obstetrics & Gynecology

## 2016-12-15 LAB — APTT: APTT: 28 s (ref 24–33)

## 2016-12-16 LAB — VON WILLEBRAND PANEL
FACTOR VIII ACTIVITY: 112 % (ref 57–163)
VON WILLEBRAND AG: 91 % (ref 50–200)
vWF Activity: 61 % (ref 50–200)

## 2016-12-29 ENCOUNTER — Other Ambulatory Visit: Payer: Self-pay | Admitting: Internal Medicine

## 2016-12-29 DIAGNOSIS — E042 Nontoxic multinodular goiter: Secondary | ICD-10-CM

## 2017-01-06 ENCOUNTER — Ambulatory Visit (INDEPENDENT_AMBULATORY_CARE_PROVIDER_SITE_OTHER): Payer: 59 | Admitting: Obstetrics & Gynecology

## 2017-01-06 ENCOUNTER — Other Ambulatory Visit (HOSPITAL_COMMUNITY)
Admission: RE | Admit: 2017-01-06 | Discharge: 2017-01-06 | Disposition: A | Discharge status: Home / Self Care | Payer: 59 | Source: Ambulatory Visit | Attending: Obstetrics & Gynecology | Admitting: Obstetrics & Gynecology

## 2017-01-06 ENCOUNTER — Ambulatory Visit
Admission: RE | Admit: 2017-01-06 | Discharge: 2017-01-06 | Disposition: A | Discharge status: Home / Self Care | Payer: 59 | Source: Ambulatory Visit | Attending: Internal Medicine | Admitting: Internal Medicine

## 2017-01-06 VITALS — BP 128/80 | HR 84 | Resp 16 | Ht 64.5 in | Wt 152.0 lb

## 2017-01-06 DIAGNOSIS — R87613 High grade squamous intraepithelial lesion on cytologic smear of cervix (HGSIL): Secondary | ICD-10-CM

## 2017-01-06 DIAGNOSIS — B977 Papillomavirus as the cause of diseases classified elsewhere: Secondary | ICD-10-CM | POA: Diagnosis not present

## 2017-01-06 DIAGNOSIS — E042 Nontoxic multinodular goiter: Secondary | ICD-10-CM

## 2017-01-06 DIAGNOSIS — R87612 Low grade squamous intraepithelial lesion on cytologic smear of cervix (LGSIL): Secondary | ICD-10-CM | POA: Diagnosis not present

## 2017-01-06 DIAGNOSIS — Z124 Encounter for screening for malignant neoplasm of cervix: Secondary | ICD-10-CM | POA: Insufficient documentation

## 2017-01-06 MED ORDER — FLUOXETINE HCL 40 MG PO CAPS: 40.0000 mg | ORAL_CAPSULE | Freq: Every day | ORAL | 4 refills | Status: DC

## 2017-01-06 NOTE — Progress Notes (Signed)
GYNECOLOGY  VISIT  CC:   Follow up pap smear, discuss son's suicide  HPI: 61 y.o. [phone] Divorced Caucasian female here for follow- up pap smear after having LEEP.  Pap will be done today.  Pt aware will not check HPV today but will repeat this at 1 year with pap smear.  Pt reports her son attempted suicide a few months ago.  He stepped in front of a tractor trailer truck.  Was in ICU for weeks and then begn to decline.  Ultimately, she withdrew care.  Has not had communication with truck driver.  Pt has been seeing therapist.  Feels she is coping.  States she is finally not worrying about her son (which she has done for years).  Husband has been very supportive.  GYNECOLOGIC HISTORY: Patient's last menstrual period was 02/17/2006. Contraception: post menopausal  Menopausal hormone therapy: premarin cream   Patient Active Problem List   Diagnosis Date Noted  . ASCUS with positive high risk HPV 04/16/2015  . Von Willebrand's disease (HCC) 01/20/2015  . Urinary frequency 09/24/2012  . Osteoporosis 02/04/2012  . Headache, chronic daily 02/04/2012  . Atrophic vaginitis 07/14/2008  . Subclinical thyrotoxicosis 07/30/2007  . Headache 05/24/2006    Past Medical History:  Diagnosis Date  . Abnormal glandular Papanicolaou smear of cervix 06/2005   followed by LEEP, + HR HPV DNA 06/2008  . Allergy   . Anemia 09/2004   iron deficient  . Anxiety   . Broken clavicle 10/23/2015   pt states she fell in bathroom   . Depression    Last psychiatrist - Dr. Melrose Nakayama - does not have records as seen prior to 2007  . Foot fracture, left 11/2006   5th MTP  . Migraine   . Osteoporosis 02/2006   DEXA 04/23/12 w/ T score L femur -2.8 and L spine -1.4  . Thyroid disease 01/2007   subclinical hyperthyroidism  . Von Willebrand's disease (HCC) 03/2005    Past Surgical History:  Procedure Laterality Date  . COLONOSCOPY N/A 10/15/2012   Procedure: COLONOSCOPY;  Surgeon: Charna Elizabeth, MD;  Location: [facility];  Service: Endoscopy;  Laterality: N/A;  . COLPORRHAPHY  01/27/15   Posterior- [facility], Dr. Gala Lewandowsky  . LEEP  2007  . LEEP N/A 05/06/2016   Procedure: LOOP ELECTROSURGICAL EXCISION PROCEDURE (LEEP) with colposcopy ;  Surgeon: Jerene Bears, MD;  Location: [facility];  Service: Gynecology;  Laterality: N/A;  . TMJ ARTHROSCOPY  1996  . TONSILLECTOMY AND ADENOIDECTOMY      MEDS:   Current Outpatient Prescriptions on File Prior to Visit  Medication Sig Dispense Refill  . ALPRAZolam (XANAX) 0.25 MG tablet Take 1 tablet (0.25 mg total) by mouth at bedtime as needed for anxiety. 30 tablet 0  . aspirin-acetaminophen-caffeine (EXCEDRIN MIGRAINE) 250-250-65 MG tablet Take 1 tablet by mouth every 8 (eight) hours as needed for headache or migraine.     . Calcium-Vitamin D 600-200 MG-UNIT tablet Take 2 tablets by mouth daily.     . Cholecalciferol (VITAMIN D3) 5000 units CAPS Take 5,000 Units by mouth once a week.    . conjugated estrogens (PREMARIN) vaginal cream Apply pea-sized amount topically to urethra and inner labia daily as directed.  Do not apply intravaginally for two weeks post op. 42.5 g 2  . FLUoxetine (PROZAC) 40 MG capsule Take 1 capsule (40 mg total) by mouth daily. 90 capsule 4  . glucosamine-chondroitin (GLUCOSAMINE-CHONDROITIN DS) 500-400 MG tablet Take 1 tablet by mouth  2 times daily at 12 noon and 4 pm.     . hydrocortisone-pramoxine (ANALPRAM-HC) 2.5-1 % rectal cream Place rectally 3 (three) times daily. Apply as needed. 30 g 0  . Magnesium Oxide 400 MG CAPS Take 2 capsules by mouth daily.     . Multiple Vitamin tablet Take 1 tablet by mouth daily.     . Omega-3 1000 MG CAPS Take 1 g by mouth daily.     . Probiotic Product (ULTRAFLORA IMMUNE HEALTH) 170 MG CAPS Take 1 capsule by mouth.    . risedronate (ACTONEL) 35 MG tablet TAKE 1 TABLET BY MOUTH EVERY 7 DAYS WITH WATER ON AN EMPTY S 4 tablet 9  . UNABLE TO FIND Similase    . UNABLE TO FIND Ultra Hepa  trope    . NON FORMULARY every morning. D Mannose  Daily ( UTI symptoms)     No current facility-administered medications on file prior to visit.     ALLERGIES: Sulfamethoxazole-trimethoprim and Macrobid [nitrofurantoin]  Family History  Problem Relation Age of Onset  . Osteoporosis Mother   . Heart Problems Mother   . Thyroid disease Mother   . Cancer Father   . Ulcers Father   . Alcohol abuse Father   . Osteoporosis Maternal Grandmother   . Alzheimer's disease Maternal Grandfather   . Depression Daughter   . Psychiatric Illness Brother     SH:  Married, non smoker  Review of Systems  All other systems reviewed and are negative.   PHYSICAL EXAMINATION:    BP 128/80 (BP Location: Left Arm, Patient Position: Sitting, Cuff Size: Normal)   Pulse 84   Resp 16   Ht 5' 4.5" (1.638 m)   Wt 152 lb (68.9 kg)   LMP 02/17/2006   BMI 25.69 kg/m     General appearance: alert, cooperative and appears stated age Lymph:  No inguinal LAD  Pelvic: External genitalia:  no lesions              Urethra:  normal appearing urethra with no masses, tenderness or lesions              Bartholins and Skenes: normal                 Vagina: normal appearing vagina with normal color and discharge, no lesions              Cervix: flush with vagina on left side, no visible lesions, s/p LEEP              Pap obtained              Anus:  no lesions  Chaperone was present for exam.  Assessment: S/p LEEP 05/06/16 with high grade dysplasia with possible positive margin H/O LGSIL pap with cells suspicious of high grade lesion  Plan: Pap obtained today.  Results will be communicated to pt.  As long as no high grade present, will repeat pap and HR HPV in six months.  ~20 minutes spent with patient >50% of time was in face to face discussion of above.

## 2017-01-09 ENCOUNTER — Encounter: Payer: Self-pay | Admitting: Obstetrics & Gynecology

## 2017-01-10 LAB — CYTOLOGY - PAP: Diagnosis: NEGATIVE

## 2017-01-11 ENCOUNTER — Telehealth: Payer: Self-pay | Admitting: *Deleted

## 2017-01-11 NOTE — Telephone Encounter (Signed)
-----   Message from Jerene BearsMary S Miller, MD sent at 01/10/2017  5:44 PM EDT ----- Please let pt know her pap smear is normal.  Needs repeat pap and HR HPV in six months.  Please make sure AEX is six months from now and not more than that.  08 recall for that appt.

## 2017-01-11 NOTE — Telephone Encounter (Signed)
Message left to return call to Emily at [phone].    

## 2017-01-16 NOTE — Telephone Encounter (Signed)
Call to patient. Results reviewed with patient as seen below from Dr. Hyacinth MeekerMiller. Patient verbalized understanding. 08 recall placed for 06/2017. AEX scheduled for 07/21/16 at 0830. Patient agreeable to date and time of appointment.   Patient agreeable to disposition. Will close encounter.

## 2017-03-13 ENCOUNTER — Other Ambulatory Visit: Payer: Self-pay | Admitting: Physician Assistant

## 2017-03-13 ENCOUNTER — Ambulatory Visit
Admission: RE | Admit: 2017-03-13 | Discharge: 2017-03-13 | Disposition: A | Discharge status: Home / Self Care | Payer: 59 | Source: Ambulatory Visit | Attending: Physician Assistant | Admitting: Physician Assistant

## 2017-03-13 DIAGNOSIS — M25512 Pain in left shoulder: Secondary | ICD-10-CM

## 2017-03-27 ENCOUNTER — Other Ambulatory Visit: Payer: Self-pay | Admitting: Obstetrics & Gynecology

## 2017-03-27 NOTE — Telephone Encounter (Signed)
Medication refill request: Prozac  Last AEX:  03-17-16  Next AEX: 07-21-17  Last MMG (if hormonal medication request): 05-02-16 WNL  Refill authorized: please advise

## 2017-04-03 ENCOUNTER — Ambulatory Visit: Payer: 59 | Admitting: Obstetrics & Gynecology

## 2017-04-18 ENCOUNTER — Other Ambulatory Visit: Payer: Self-pay | Admitting: Obstetrics & Gynecology

## 2017-04-18 DIAGNOSIS — Z1231 Encounter for screening mammogram for malignant neoplasm of breast: Secondary | ICD-10-CM

## 2017-05-09 ENCOUNTER — Ambulatory Visit
Admission: RE | Admit: 2017-05-09 | Discharge: 2017-05-09 | Disposition: A | Discharge status: Home / Self Care | Payer: 59 | Source: Ambulatory Visit | Attending: Obstetrics & Gynecology | Admitting: Obstetrics & Gynecology

## 2017-05-09 DIAGNOSIS — Z1231 Encounter for screening mammogram for malignant neoplasm of breast: Secondary | ICD-10-CM

## 2017-06-14 ENCOUNTER — Telehealth: Payer: Self-pay | Admitting: Obstetrics & Gynecology

## 2017-06-14 NOTE — Telephone Encounter (Signed)
Left patient a message to call back to reschedule a future appointment that was cancelled by the provider to see Dr. Miller for an AEX. °

## 2017-07-12 ENCOUNTER — Other Ambulatory Visit: Payer: Self-pay | Admitting: Obstetrics & Gynecology

## 2017-07-12 NOTE — Telephone Encounter (Signed)
Medication refill request: Actonel  Last AEX:  03-17-16  Next AEX: 08-03-17  Last MMG (if hormonal medication request): 05-09-17 WNL  Refill authorized: please advise

## 2017-07-12 NOTE — Telephone Encounter (Signed)
[facility] called requesting verbal authorization on refills for risedronate. He said the patient has already gone a couple of days without it.

## 2017-07-13 MED ORDER — RISEDRONATE SODIUM 35 MG PO TABS: ORAL_TABLET | ORAL | 1 refills | Status: DC

## 2017-07-18 ENCOUNTER — Other Ambulatory Visit: Payer: Self-pay | Admitting: Obstetrics & Gynecology

## 2017-07-18 NOTE — Telephone Encounter (Signed)
Medication refill request: Actonel  Last AEX:  03-17-16  Next AEX: 08-03-17  Last MMG (if hormonal medication request): 05-09-17 WNL  Refill authorized: please advise  

## 2017-07-18 NOTE — Telephone Encounter (Signed)
Dr Hyacinth MeekerMiller just sent in a one month Script with one refill. She has an appointment to see Dr Hyacinth MeekerMiller in a few weeks.

## 2017-07-21 ENCOUNTER — Ambulatory Visit: Payer: Self-pay | Admitting: Obstetrics & Gynecology

## 2017-08-03 ENCOUNTER — Ambulatory Visit (INDEPENDENT_AMBULATORY_CARE_PROVIDER_SITE_OTHER): Payer: 59 | Admitting: Obstetrics & Gynecology

## 2017-08-03 ENCOUNTER — Other Ambulatory Visit (HOSPITAL_COMMUNITY)
Admission: RE | Admit: 2017-08-03 | Discharge: 2017-08-03 | Disposition: A | Discharge status: Home / Self Care | Payer: 59 | Source: Ambulatory Visit | Attending: Obstetrics & Gynecology | Admitting: Obstetrics & Gynecology

## 2017-08-03 ENCOUNTER — Other Ambulatory Visit: Payer: Self-pay

## 2017-08-03 ENCOUNTER — Encounter: Payer: Self-pay | Admitting: Obstetrics & Gynecology

## 2017-08-03 VITALS — BP 130/68 | HR 76 | Resp 14 | Ht 64.0 in | Wt 162.2 lb

## 2017-08-03 DIAGNOSIS — Z01419 Encounter for gynecological examination (general) (routine) without abnormal findings: Secondary | ICD-10-CM | POA: Diagnosis not present

## 2017-08-03 DIAGNOSIS — Z124 Encounter for screening for malignant neoplasm of cervix: Secondary | ICD-10-CM | POA: Diagnosis not present

## 2017-08-03 DIAGNOSIS — M810 Age-related osteoporosis without current pathological fracture: Secondary | ICD-10-CM

## 2017-08-03 MED ORDER — VALACYCLOVIR HCL 500 MG PO TABS: 500.0000 mg | ORAL_TABLET | Freq: Two times a day (BID) | ORAL | 1 refills | Status: DC

## 2017-08-03 NOTE — Progress Notes (Signed)
62 y.o. [id number] DivorcedCaucasianF here for annual exam.  Having a lot of issues with right hip pain that is primarily with walking.  Had x-rays and MRI showing arthritis.  Was treated with prednisone and had mild psychosis symptoms with this.  This was stopped and symptoms resolved.  Saw Dr. Juliene Pina at [facility].  Had a second opinion with Dr. Charlann Boxer but pain wasn't really present the day she saw him.  Now this is bothering her so much now that she is having to use a cane.    Does have some left shoulder crackling with movement but this is not painful and left knee "locks" on occasion but this isn't painful.    Denies vaginal bleeding.   PCP;    Patient's last menstrual period was 02/17/2006.          Sexually active: Yes.    The current method of family planning is post menopausal status.    Exercising: No.  The patient does not participate in regular exercise at present. Smoker:  no  Health Maintenance: Pap:  01/06/17 Neg  03/17/16 LSIL, HR HPV:+detected  History of abnormal Pap:  Yes, LEEP 2018 MMG:  05/09/17 BIRADS1:Neg  Colonoscopy:  10/15/12 Normal. F/u 7 years  BMD:   04/28/15 Osteoporosis TDaP:  2016 Pneumonia vaccine(s): No Shingrix: No.  Shingrix vaccine discussed today.   Hep C testing: 03/17/16 Neg  Screening Labs: PCP   reports that she quit smoking about 18 years ago. Her smoking use included cigarettes. She has a 2.00 pack-year smoking history. She has never used smokeless tobacco. She reports that she drinks about 6.0 oz of alcohol per week. She reports that she does not use drugs.  Past Medical History:  Diagnosis Date  . Abnormal glandular Papanicolaou smear of cervix 06/2005   followed by LEEP, + HR HPV DNA 06/2008  . Allergy   . Anemia 09/2004   iron deficient  . Anxiety   . Arthritis, hip   . Broken clavicle 10/23/2015   pt states she fell in bathroom   . Depression    Last psychiatrist - Dr. Melrose Nakayama - does not have records as seen prior to 2007  . Foot  fracture, left 11/2006   5th MTP  . Migraine   . Osteoporosis 02/2006   DEXA 04/23/12 w/ T score L femur -2.8 and L spine -1.4  . Thyroid disease 01/2007   subclinical hyperthyroidism  . Von Willebrand's disease (HCC) 03/2005    Past Surgical History:  Procedure Laterality Date  . COLONOSCOPY N/A 10/15/2012   Procedure: COLONOSCOPY;  Surgeon: Charna Elizabeth, MD;  Location: [facility];  Service: Endoscopy;  Laterality: N/A;  . COLPORRHAPHY  01/27/15   Posterior- [place], Dr. Gala Lewandowsky  . LEEP  2007  . LEEP N/A 05/06/2016   Procedure: LOOP ELECTROSURGICAL EXCISION PROCEDURE (LEEP) with colposcopy ;  Surgeon: Jerene Bears, MD;  Location: [facility];  Service: Gynecology;  Laterality: N/A;  . TMJ ARTHROSCOPY  1996  . TONSILLECTOMY AND ADENOIDECTOMY      Current Outpatient Medications  Medication Sig Dispense Refill  . ALPRAZolam (XANAX) 0.25 MG tablet Take 1 tablet (0.25 mg total) by mouth at bedtime as needed for anxiety. 30 tablet 0  . aspirin-acetaminophen-caffeine (EXCEDRIN MIGRAINE) 250-250-65 MG tablet Take 1 tablet by mouth every 8 (eight) hours as needed for headache or migraine.     . Calcium-Vitamin D 600-200 MG-UNIT tablet Take 2 tablets by mouth daily.     . Cholecalciferol (  VITAMIN D3) 5000 units CAPS Take 5,000 Units by mouth once a week.    . conjugated estrogens (PREMARIN) vaginal cream daily as needed.    Marland Kitchen FLUoxetine (PROZAC) 40 MG capsule TAKE 1 CAPSULE BY MOUTH ONCE DAILY 90 capsule 1  . glucosamine-chondroitin (GLUCOSAMINE-CHONDROITIN DS) 500-400 MG tablet Take 1 tablet by mouth 2 times daily at 12 noon and 4 pm.     . hydrocortisone-pramoxine (ANALPRAM-HC) 2.5-1 % rectal cream Place rectally 3 (three) times daily. Apply as needed. 30 g 0  . Magnesium Oxide 400 MG CAPS Take 2 capsules by mouth daily.     . Multiple Vitamin tablet Take 1 tablet by mouth daily.     . NON FORMULARY every morning. D Mannose  Daily ( UTI symptoms)    . Omega-3 1000 MG  CAPS Take 1 g by mouth daily.     . Probiotic Product (ULTRAFLORA IMMUNE HEALTH) 170 MG CAPS Take 1 capsule by mouth.    . risedronate (ACTONEL) 35 MG tablet TAKE 1 TABLET BY MOUTH EVERY 7 DAYS WITH WATER ON AN EMPTY STOMACH 4 tablet 1  . UNABLE TO FIND Similase    . UNABLE TO FIND Ultra Hepa trope    . valACYclovir (VALTREX) 500 MG tablet Take by mouth as needed.     No current facility-administered medications for this visit.     Family History  Problem Relation Age of Onset  . Osteoporosis Mother   . Heart Problems Mother   . Thyroid disease Mother   . Cancer Father   . Ulcers Father   . Alcohol abuse Father   . Osteoporosis Maternal Grandmother   . Alzheimer's disease Maternal Grandfather   . Depression Daughter   . Psychiatric Illness Brother     Review of Systems  Constitutional:       Weight gain  Gastrointestinal:       Change in quality of stools   Genitourinary:       Loss of urine with sneeze   Musculoskeletal: Positive for joint pain and myalgias.  Skin:       New or changed mole/lump   Neurological: Positive for headaches.  Endo/Heme/Allergies: Bruises/bleeds easily.       Heat cold intolerance   Psychiatric/Behavioral: Positive for depression.       Excessive crying   All other systems reviewed and are negative.   Exam:   BP 130/68 (BP Location: Right Arm, Patient Position: Sitting, Cuff Size: Large)   Pulse 76   Resp 14   Ht  (1.626 m)   Wt 162 lb 3.2 oz (73.6 kg)   LMP 02/17/2006   BMI 27.84 kg/m     Height:  (162.6 cm)  Ht Readings from Last 3 Encounters:  08/03/17  (1.626 m)  01/06/17 5' 4.5" (1.638 m)  05/25/16 5' 4.5" (1.638 m)    General appearance: alert, cooperative and appears stated age Head: Normocephalic, without obvious abnormality, atraumatic Neck: no adenopathy, supple, symmetrical, trachea midline and thyroid normal to inspection and palpation Lungs: clear to auscultation bilaterally Breasts: normal appearance,  no masses or tenderness Heart: regular rate and rhythm Abdomen: soft, non-tender; bowel sounds normal; no masses,  no organomegaly Extremities: extremities normal, atraumatic, no cyanosis or edema Skin: Skin color, texture, turgor normal. No rashes or lesions Lymph nodes: Cervical, supraclavicular, and axillary nodes normal. No abnormal inguinal nodes palpated Neurologic: Grossly normal   Pelvic: External genitalia:  no lesions  Urethra:  normal appearing urethra with no masses, tenderness or lesions              Bartholins and Skenes: normal                 Vagina: normal appearing vagina with normal color and discharge, no lesions              Cervix: no lesions              Pap taken: Yes.   Bimanual Exam:  Uterus:  normal size, contour, position, consistency, mobility, non-tender              Adnexa: normal adnexa and no mass, fullness, tenderness               Rectovaginal: Confirms               Anus:  normal sphincter tone, no lesions  Chaperone was present for exam.  A:  Well Woman with normal exam PMP, no HRT Vaginal atrophic changes Long term hx of abnormal pap smears s/p LEEP 2/18 with high grade findings.  6 month follow up pap smear was negative. Depression after death of son, has improved some this year Right hip pain, MRI per pt with Labrum tear, using a cane now H/O recurrent UTIs that is improved H/O HSV, uses valtrex with outbreaks prn Osteoporosis.  BMD due.  On Actonel  P:   Mammogram guidelines reviewed.  MMG UTD. pap smear and HR HPV obtained today Lab work done with PCP Reviewed shingrix vaccination BMD is due.  Order placed.  Pt will schedule. RF for Valtrex given to use if needed Colonoscopy UTD. return annually or prn

## 2017-08-03 NOTE — Progress Notes (Signed)
Scheduled patient to see Dr.Olin on 08/07/2017 at 8 am. Patient is agreeable to date and time.

## 2017-08-07 LAB — CYTOLOGY - PAP
Diagnosis: NEGATIVE
HPV: NOT DETECTED

## 2017-09-13 ENCOUNTER — Ambulatory Visit
Admission: RE | Admit: 2017-09-13 | Discharge: 2017-09-13 | Disposition: A | Discharge status: Home / Self Care | Payer: 59 | Source: Ambulatory Visit | Attending: Obstetrics & Gynecology | Admitting: Obstetrics & Gynecology

## 2017-09-13 DIAGNOSIS — M810 Age-related osteoporosis without current pathological fracture: Secondary | ICD-10-CM

## 2017-09-14 ENCOUNTER — Other Ambulatory Visit: Payer: Self-pay | Admitting: Obstetrics & Gynecology

## 2017-09-15 NOTE — Telephone Encounter (Signed)
Medication refill request: actonel Last AEX:  08-03-17 Next AEX: 10-30-18 Last MMG (if hormonal medication request): 2/19 neg Refill authorized: bmd was done 6/19. Please approve if appropriate

## 2017-09-29 ENCOUNTER — Other Ambulatory Visit: Payer: Self-pay | Admitting: Obstetrics & Gynecology

## 2017-09-29 MED ORDER — ALPRAZOLAM 0.25 MG PO TABS: 0.2500 mg | ORAL_TABLET | Freq: Every evening | ORAL | 0 refills | Status: AC | PRN

## 2017-12-04 ENCOUNTER — Other Ambulatory Visit: Payer: Self-pay | Admitting: Obstetrics & Gynecology

## 2017-12-04 NOTE — Telephone Encounter (Signed)
Medication refill request: Prozac 40 mg Last AEX:  08/03/17  Next AEX: 10/30/18 Last MMG (if hormonal medication request): 05/09/17  Category 1 neg  Refill authorized:  Please advise

## 2018-04-23 ENCOUNTER — Other Ambulatory Visit: Payer: Self-pay | Admitting: Obstetrics & Gynecology

## 2018-04-23 NOTE — Telephone Encounter (Signed)
Medication refill request: actonel 35mg  Last AEX:  08/03/17 SM Next AEX: 10/30/18 Last MMG (if hormonal medication request): 05/09/17 BIRADS1:neg  Refill authorized: 09/15/17 #[id number]/[email or web address]. Today please advise.

## 2018-06-02 ENCOUNTER — Encounter: Payer: Self-pay | Admitting: Obstetrics & Gynecology

## 2018-10-25 ENCOUNTER — Other Ambulatory Visit: Payer: Self-pay

## 2018-10-30 ENCOUNTER — Ambulatory Visit (INDEPENDENT_AMBULATORY_CARE_PROVIDER_SITE_OTHER): Payer: 59 | Admitting: Obstetrics & Gynecology

## 2018-10-30 ENCOUNTER — Other Ambulatory Visit: Payer: Self-pay

## 2018-10-30 ENCOUNTER — Other Ambulatory Visit (HOSPITAL_COMMUNITY)
Admission: RE | Admit: 2018-10-30 | Discharge: 2018-10-30 | Disposition: A | Discharge status: Home / Self Care | Payer: 59 | Source: Ambulatory Visit | Attending: Obstetrics & Gynecology | Admitting: Obstetrics & Gynecology

## 2018-10-30 ENCOUNTER — Encounter: Payer: Self-pay | Admitting: Obstetrics & Gynecology

## 2018-10-30 VITALS — BP 102/62 | HR 84 | Temp 97.7°F | Ht 64.0 in | Wt 162.2 lb

## 2018-10-30 DIAGNOSIS — N871 Moderate cervical dysplasia: Secondary | ICD-10-CM

## 2018-10-30 DIAGNOSIS — Z124 Encounter for screening for malignant neoplasm of cervix: Secondary | ICD-10-CM | POA: Diagnosis not present

## 2018-10-30 DIAGNOSIS — M810 Age-related osteoporosis without current pathological fracture: Secondary | ICD-10-CM | POA: Diagnosis not present

## 2018-10-30 DIAGNOSIS — Z01419 Encounter for gynecological examination (general) (routine) without abnormal findings: Secondary | ICD-10-CM

## 2018-10-30 MED ORDER — RISEDRONATE SODIUM 35 MG PO TABS: ORAL_TABLET | ORAL | 12 refills | Status: DC

## 2018-10-30 NOTE — Progress Notes (Signed)
63 y.o. [id number] Married White or Caucasian female here for annual exam.  Doing well.  Denies vaginal bleeding.  Saw Dr. Merlyn LotKuzma early this year.  Was given braces to wear.  Pain is much better.  She also saw Dr. Charlann Boxerlin last year.  Medication has really helped.  She recently saw a psychiatry PA as was having focus issues.  Is on a low dose of ritalin.  This has really helped.    Patient's last menstrual period was 02/17/2006.          Sexually active: Yes.    The current method of family planning is post menopausal status.    Exercising: Yes.    walking, bike Smoker:  no  Health Maintenance: Pap:  08/03/17 Neg. HR HPV:neg   01/06/17 neg  History of abnormal Pap:  Yes, LEEP 2018 MMG:  05/09/17 BIRADS1:neg.  Hasn't done it yet due to Covid.   Colonoscopy:  10/15/12 Normal. F/u 7 years  BMD:   09/13/17 Osteoporosis  TDaP:  2016 Pneumonia vaccine(s):  no Shingrix:   No Hep C testing: 03/17/16 neg Screening Labs: PCP   reports that she quit smoking about 19 years ago. Her smoking use included cigarettes. She has a 2.00 pack-year smoking history. She has never used smokeless tobacco. She reports current alcohol use of about 7.0 standard drinks of alcohol per week. She reports that she does not use drugs.  Past Medical History:  Diagnosis Date  . Abnormal glandular Papanicolaou smear of cervix 06/2005   followed by LEEP, + HR HPV DNA 06/2008  . Allergy   . Anemia 09/2004   iron deficient  . Anxiety   . Arthritis, hip   . Broken clavicle 10/23/2015   pt states she fell in bathroom   . Depression    Last psychiatrist - Dr. Melrose NakayamaPascal - does not have records as seen prior to 2007  . Foot fracture, left 11/2006   5th MTP  . Migraine   . Osteoporosis 02/2006   DEXA 04/23/12 w/ T score L femur -2.8 and L spine -1.4  . Thyroid disease 01/2007   subclinical hyperthyroidism  . Von Willebrand's disease (HCC) 03/2005    Past Surgical History:  Procedure Laterality Date  . COLONOSCOPY N/A 10/15/2012    Procedure: COLONOSCOPY;  Surgeon: Charna ElizabethJyothi Mann, MD;  Location: [facility];  Service: Endoscopy;  Laterality: N/A;  . COLPORRHAPHY  01/27/15   Posterior- [place], Dr. Gala LewandowskyBadlani  . LEEP  2007  . LEEP N/A 05/06/2016   Procedure: LOOP ELECTROSURGICAL EXCISION PROCEDURE (LEEP) with colposcopy ;  Surgeon: Jerene BearsMary S Nasha Diss, MD;  [facility];  Service: Gynecology;  Laterality: N/A;  . TMJ ARTHROSCOPY  1996  . TONSILLECTOMY AND ADENOIDECTOMY      Current Outpatient Medications  Medication Sig Dispense Refill  . ALPRAZolam (XANAX) 0.25 MG tablet Take 1 tablet (0.25 mg total) by mouth at bedtime as needed for anxiety. 30 tablet 0  . aspirin-acetaminophen-caffeine (EXCEDRIN MIGRAINE) 250-250-65 MG tablet Take 1 tablet by mouth every 8 (eight) hours as needed for headache or migraine.     . Calcium-Vitamin D 600-200 MG-UNIT tablet Take 2 tablets by mouth daily.     . Cholecalciferol (VITAMIN D3) 5000 units CAPS Take 5,000 Units by mouth once a week.    . conjugated estrogens (PREMARIN) vaginal cream daily as needed.    . DUEXIS 800-26.6 MG TABS Take 1 tablet by mouth 3 (three) times daily.    Marland Kitchen. FLUoxetine (PROZAC) 40  MG capsule TAKE 1 CAPSULE BY MOUTH ONCE DAILY 90 capsule 4  . glucosamine-chondroitin (GLUCOSAMINE-CHONDROITIN DS) 500-400 MG tablet Take 1 tablet by mouth 2 times daily at 12 noon and 4 pm.     . hydrocortisone-pramoxine (ANALPRAM-HC) 2.5-1 % rectal cream Place rectally 3 (three) times daily. Apply as needed. 30 g 0  . Magnesium Oxide 400 MG CAPS Take 2 capsules by mouth daily.     . methylphenidate (RITALIN) 20 MG tablet TK 1 T PO QAM.    . Multiple Vitamin tablet Take 1 tablet by mouth daily.     . Omega-3 1000 MG CAPS Take 1 g by mouth daily.     . risedronate (ACTONEL) 35 MG tablet TAKE 1 TABLET BY MOUTH EVERY 7 DAYS. TAKE WITH WATER ON AN EMPTY STOMACH 4 tablet 7  . valACYclovir (VALTREX) 500 MG tablet Take 1 tablet (500 mg total) by mouth 2 (two) times daily.  Take BID x 3 days with symptoms. 30 tablet 1   No current facility-administered medications for this visit.     Family History  Problem Relation Age of Onset  . Osteoporosis Mother   . Heart Problems Mother   . Thyroid disease Mother   . Cancer Father   . Ulcers Father   . Alcohol abuse Father   . Osteoporosis Maternal Grandmother   . Alzheimer's disease Maternal Grandfather   . Depression Daughter   . Psychiatric Illness Brother     Review of Systems  All other systems reviewed and are negative.   Exam:   BP 102/62   Pulse 84   Temp 97.7 F (36.5 C) (Temporal)   Ht 5\' 4"  (1.626 m)   Wt 162 lb 3.2 oz (73.6 kg)   LMP 02/17/2006   BMI 27.84 kg/m    Height: 5\' 4"  (162.6 cm)  Ht Readings from Last 3 Encounters:  10/30/18 5\' 4"  (1.626 m)  08/03/17 5\' 4"  (1.626 m)  01/06/17 5' 4.5" (1.638 m)    General appearance: alert, cooperative and appears stated age Head: Normocephalic, without obvious abnormality, atraumatic Neck: no adenopathy, supple, symmetrical, trachea midline and thyroid normal to inspection and palpation Lungs: clear to auscultation bilaterally Breasts: normal appearance, no masses or tenderness Heart: regular rate and rhythm Abdomen: soft, non-tender; bowel sounds normal; no masses,  no organomegaly Extremities: extremities normal, atraumatic, no cyanosis or edema Skin: Skin color, texture, turgor normal. No rashes or lesions Lymph nodes: Cervical, supraclavicular, and axillary nodes normal. No abnormal inguinal nodes palpated Neurologic: Grossly normal   Pelvic: External genitalia:  no lesions              Urethra:  normal appearing urethra with no masses, tenderness or lesions              Bartholins and Skenes: normal                 Vagina: normal appearing vagina with normal color and discharge, no lesions              Cervix: no lesions              Pap taken: Yes.   Bimanual Exam:  Uterus:  normal size, contour, position, consistency,  mobility, non-tender              Adnexa: normal adnexa and no mass, fullness, tenderness               Rectovaginal: Confirms  Anus:  normal sphincter tone, no lesions  Chaperone was present for exam.  A:  Well Woman with normal exam PP, no HRT Vaginal atrophic changes Long hx of abnormal pap smears s/p LEEP 2/18 with hihg grade findings H/O depression H/O HSV, uses valtrex with outbreaks (doesn't needs rx today) Osteoporosis on Actonel  P:    Mammogram guidelines reviewed.  Declines having this done today. Lab work is done with PCP BMD due next summer.  Order placed. Pap and HR HPV obtained today Colonoscopy due next year RF for Actonel 35mg  weekly.  #4/12RF Return annually or prn

## 2018-11-01 ENCOUNTER — Other Ambulatory Visit: Payer: Self-pay | Admitting: Obstetrics & Gynecology

## 2018-11-01 DIAGNOSIS — Z1231 Encounter for screening mammogram for malignant neoplasm of breast: Secondary | ICD-10-CM

## 2018-11-01 LAB — CYTOLOGY - PAP
Diagnosis: NEGATIVE
HPV: NOT DETECTED

## 2018-12-11 ENCOUNTER — Other Ambulatory Visit: Payer: Self-pay | Admitting: Obstetrics & Gynecology

## 2018-12-11 NOTE — Telephone Encounter (Signed)
Medication refill request: Fluoxetine Last AEX:  10/30/18 SM Next AEX: 01/10/20 Last MMG (if hormonal medication request): 05/09/17 BIRADS 1 negative/density b -- scheduled 12/19/18 Refill authorized: Please advise;   Patient requests refill for both Risedronate and Fluoxetine. Spoke with pharmacist, Risedronate does not need to be sent in as they have enough refills already. Please advise on Fluoxetine.

## 2018-12-11 NOTE — Telephone Encounter (Signed)
Patient requesting refill requests on risedronate and fluoxetine to [facility].

## 2018-12-12 NOTE — Telephone Encounter (Signed)
Refill has been completed.  Please let pt know the anti-inflammatory she was given in July can increased the concentration of Fluoxetine in her blood stream if she is taking that medication regularly.  It's just an FYI for her to know.  Thanks.

## 2018-12-13 MED ORDER — FLUOXETINE HCL 40 MG PO CAPS: 40.0000 mg | ORAL_CAPSULE | Freq: Every day | ORAL | 4 refills | Status: DC

## 2018-12-13 NOTE — Telephone Encounter (Signed)
The following message was discussed in detail with patient as seen below. Prescription was sent to [facility] mail order pharmacy. Closing encounter.

## 2018-12-18 ENCOUNTER — Telehealth: Payer: Self-pay | Admitting: Obstetrics & Gynecology

## 2018-12-18 NOTE — Telephone Encounter (Signed)
Requesting refill for risedronate to [facility].

## 2018-12-18 NOTE — Telephone Encounter (Signed)
Medication refill request: Risedronate Last AEX:  10/30/2018 SM Next AEX: 01/10/2020 Last MMG (if hormonal medication request): BIRADS 1 Negative Density B  Refill authorized: This prescription was sent to [facility] but pt would like it sent to [facility] instead. Please advise.

## 2018-12-19 ENCOUNTER — Ambulatory Visit
Admission: RE | Admit: 2018-12-19 | Discharge: 2018-12-19 | Disposition: A | Discharge status: Home / Self Care | Payer: 59 | Source: Ambulatory Visit | Attending: Obstetrics & Gynecology | Admitting: Obstetrics & Gynecology

## 2018-12-19 ENCOUNTER — Other Ambulatory Visit: Payer: Self-pay

## 2018-12-19 ENCOUNTER — Other Ambulatory Visit: Payer: Self-pay | Admitting: Obstetrics & Gynecology

## 2018-12-19 DIAGNOSIS — Z1231 Encounter for screening mammogram for malignant neoplasm of breast: Secondary | ICD-10-CM

## 2018-12-19 MED ORDER — RISEDRONATE SODIUM 35 MG PO TABS: 35.0000 mg | ORAL_TABLET | ORAL | 3 refills | Status: DC

## 2018-12-19 NOTE — Telephone Encounter (Signed)
Rx sent for 90 day supply to [facility].  Ok to close encounter.

## 2019-04-10 ENCOUNTER — Telehealth: Payer: Self-pay | Admitting: Obstetrics & Gynecology

## 2019-04-10 NOTE — Telephone Encounter (Signed)
Last AEX 10/30/18  Dr. Hyacinth Meeker -please review and advise on Rx change, if appropriate.

## 2019-04-10 NOTE — Telephone Encounter (Signed)
Patient is calling regarding prescription for Risedronate 35MG  tablet. Patient stated that it is no longer covered under insurance and the insurance is suggesting a switch to Ibandronate or Alendronate. Patient is wanting to know if those are possible substitute options.

## 2019-04-22 MED ORDER — ALENDRONATE SODIUM 70 MG PO TABS: 70.0000 mg | ORAL_TABLET | ORAL | 2 refills | Status: DC

## 2019-04-22 NOTE — Telephone Encounter (Signed)
Spoke with patient, advised per Dr. Hyacinth Meeker. Patient request 90 day supply to [facility]. Rx sent. Patient verbalizes understanding and is agreeable.   Encounter closed.

## 2019-04-22 NOTE — Telephone Encounter (Signed)
It is fine for her to use alendronate.  70mg  once weekly.  Take on empty stomach with water only.  Be upright and do not eat or drink anything for 30 minutes.  Ok to send in one month supply or 3 month supply as desired by pt.  Thanks.

## 2019-06-05 ENCOUNTER — Ambulatory Visit (INDEPENDENT_AMBULATORY_CARE_PROVIDER_SITE_OTHER): Payer: 59 | Admitting: Family Medicine

## 2019-06-05 ENCOUNTER — Other Ambulatory Visit: Payer: Self-pay

## 2019-06-05 VITALS — BP 112/70 | Ht 64.5 in | Wt 160.0 lb

## 2019-06-05 DIAGNOSIS — G8929 Other chronic pain: Secondary | ICD-10-CM

## 2019-06-05 DIAGNOSIS — M25562 Pain in left knee: Secondary | ICD-10-CM

## 2019-06-05 DIAGNOSIS — M25551 Pain in right hip: Secondary | ICD-10-CM

## 2019-06-05 NOTE — Patient Instructions (Signed)
Your knee is subluxing with a lax ACL. The strengthening exercises are very important for this - do quad and hamstring strengthening once a day as directed. Medications, icing only if needed but are not going to help with this. Knee brace is a consideration if it feels unstable when you're up and walking around.  You have piriformis syndrome with a weak gluteus medius. Try to avoid painful activities when possible. Pick 2-3 stretches where you feel the pull in the area of pain - do 3 of these and hold for 20-30 seconds twice a day. Hip abduction exercises as directed 3 sets of 10 once a day - add ankle weight if this becomes too easy. Ibuprofen 600mg  three times a day with food OR aleve 2 tabs twice a day with food for pain and inflammation as needed. Consider physical therapy. Follow up with me in 6 weeks.

## 2019-06-05 NOTE — Progress Notes (Signed)
PCP: Renford Dills, MD  Subjective:   HPI: Patient is a 64 y.o. female here for right hip pain and left knee pain.  Right hip pain is located within the right posterior gluteal region. It does not radiate down her legs and she denies back pain. Symptoms started 3 weeks ago where she would endorse recurrence of pain any time she tried to stand from seated position. The pain eventually will extinguish after she starts walking. She has history of right hip osteoarthritis that has been treated for years with ibuprofen with good relief but distinguishes these pain symptoms.   Left knee symptoms have been present for at least 3 years. It is not true pain but rather a locking of the knee joint whenever she attempts to extend the leg. She has previously seen [facility] for this issue who provided cortisone injections however resulted in no relief. Today in the exam room, patient demonstrates similar action of knee extension that produces audible pop. Patient denies pain with popping sensation as well as knee instability or trauma to the knee.   Past Medical History:  Diagnosis Date  . Abnormal glandular Papanicolaou smear of cervix 06/2005   followed by LEEP, + HR HPV DNA 06/2008  . Allergy   . Anemia 09/2004   iron deficient  . Anxiety   . Arthritis, hip   . Broken clavicle 10/23/2015   pt states she fell in bathroom   . Depression    Last psychiatrist - Dr. Melrose Nakayama - does not have records as seen prior to 2007  . Foot fracture, left 11/2006   5th MTP  . Migraine   . Osteoporosis 02/2006   DEXA 04/23/12 w/ T score L femur -2.8 and L spine -1.4  . Thyroid disease 01/2007   subclinical hyperthyroidism  . Von Willebrand's disease (HCC) 03/2005    Current Outpatient Medications on File Prior to Visit  Medication Sig Dispense Refill  . alendronate (FOSAMAX) 70 MG tablet Take 1 tablet (70 mg total) by mouth every 7 (seven) days. Take with a full glass of water on an empty stomach. 12 tablet 2   . ALPRAZolam (XANAX) 0.25 MG tablet Take 1 tablet (0.25 mg total) by mouth at bedtime as needed for anxiety. 30 tablet 0  . aspirin-acetaminophen-caffeine (EXCEDRIN MIGRAINE) 250-250-65 MG tablet Take 1 tablet by mouth every 8 (eight) hours as needed for headache or migraine.     . Calcium-Vitamin D 600-200 MG-UNIT tablet Take 2 tablets by mouth daily.     . Cholecalciferol (VITAMIN D3) 5000 units CAPS Take 5,000 Units by mouth once a week.    . conjugated estrogens (PREMARIN) vaginal cream daily as needed.    . DUEXIS 800-26.6 MG TABS Take 1 tablet by mouth 3 (three) times daily.    Marland Kitchen FLUoxetine (PROZAC) 40 MG capsule Take 1 capsule (40 mg total) by mouth daily. 90 capsule 4  . glucosamine-chondroitin (GLUCOSAMINE-CHONDROITIN DS) 500-400 MG tablet Take 1 tablet by mouth 2 times daily at 12 noon and 4 pm.     . hydrocortisone-pramoxine (ANALPRAM-HC) 2.5-1 % rectal cream Place rectally 3 (three) times daily. Apply as needed. 30 g 0  . Magnesium Oxide 400 MG CAPS Take 2 capsules by mouth daily.     . methylphenidate (RITALIN) 20 MG tablet TK 1 T PO QAM.    . Multiple Vitamin tablet Take 1 tablet by mouth daily.     . Omega-3 1000 MG CAPS Take 1 g by mouth daily.     Marland Kitchen  valACYclovir (VALTREX) 500 MG tablet Take 1 tablet (500 mg total) by mouth 2 (two) times daily. Take BID x 3 days with symptoms. 30 tablet 1   No current facility-administered medications on file prior to visit.    Past Surgical History:  Procedure Laterality Date  . COLONOSCOPY N/A 10/15/2012   Procedure: COLONOSCOPY;  Surgeon: Charna Elizabeth, MD;  Location: [facility];  Service: Endoscopy;  Laterality: N/A;  . COLPORRHAPHY  01/27/15   Posterior- [facility], Dr. Gala Lewandowsky  . LEEP  2007  . LEEP N/A 05/06/2016   Procedure: LOOP ELECTROSURGICAL EXCISION PROCEDURE (LEEP) with colposcopy ;  Surgeon: Jerene Bears, MD;  Location: [facility];  Service: Gynecology;  Laterality: N/A;  . TMJ ARTHROSCOPY  1996  .  TONSILLECTOMY AND ADENOIDECTOMY      Allergies  Allergen Reactions  . Sulfamethoxazole-Trimethoprim Other (See Comments)    sick  . Macrobid [Nitrofurantoin] Nausea And Vomiting    Social History   Socioeconomic History  . Marital status: Married    Spouse name: Not on file  . Number of children: Not on file  . Years of education: Not on file  . Highest education level: Not on file  Occupational History  . Not on file  Tobacco Use  . Smoking status: Former Smoker    Packs/day: 1.00    Years: 2.00    Pack years: 2.00    Types: Cigarettes    Quit date: 03/29/1999    Years since quitting: 20.2  . Smokeless tobacco: Never Used  Substance and Sexual Activity  . Alcohol use: Yes    Alcohol/week: 7.0 standard drinks    Types: 7 Glasses of wine per week  . Drug use: No  . Sexual activity: Yes    Birth control/protection: Post-menopausal  Other Topics Concern  . Not on file  Social History Narrative  . Not on file   Social Determinants of Health   Financial Resource Strain:   . Difficulty of Paying Living Expenses: Not on file  Food Insecurity:   . Worried About Programme researcher, broadcasting/film/video in the Last Year: Not on file  . Ran Out of Food in the Last Year: Not on file  Transportation Needs:   . Lack of Transportation (Medical): Not on file  . Lack of Transportation (Non-Medical): Not on file  Physical Activity:   . Days of Exercise per Week: Not on file  . Minutes of Exercise per Session: Not on file  Stress:   . Feeling of Stress : Not on file  Social Connections:   . Frequency of Communication with Friends and Family: Not on file  . Frequency of Social Gatherings with Friends and Family: Not on file  . Attends Religious Services: Not on file  . Active Member of Clubs or Organizations: Not on file  . Attends Banker Meetings: Not on file  . Marital Status: Not on file  Intimate Partner Violence:   . Fear of Current or Ex-Partner: Not on file  . Emotionally  Abused: Not on file  . Physically Abused: Not on file  . Sexually Abused: Not on file    Family History  Problem Relation Age of Onset  . Osteoporosis Mother   . Heart Problems Mother   . Thyroid disease Mother   . Cancer Father   . Ulcers Father   . Alcohol abuse Father   . Osteoporosis Maternal Grandmother   . Alzheimer's disease Maternal Grandfather   . Depression Daughter   .  Psychiatric Illness Brother     BP 112/70   Ht 5' 4.5" (1.638 m)   Wt 160 lb (72.6 kg)   LMP 02/17/2006   BMI 27.04 kg/m   Review of Systems: See HPI above.     Objective:  Physical Exam:  General: Alert and oriented x3, NAD, comfortable in exam room HEENT: normocephalic and atraumatic, EOMI, conjunctivae and sclerae clear, MMM, no erythema or exudates  Right Hip Inspection: no swelling, no scarring, no overlying bruising or breaks in the skin, no obvious bony abnormalities Palpation: not TTP over greater trochanter or hamstrings, mildly tender to palpation at the glut medius, piriformis ROM (passive): FROM ROM (active): complete ROM in all directions Strength: hip strength 5/5 in all directions except hip abduction 3/5 Special Tests: negative FADER and FABIR, negative straight leg raise  Left Knee Inspection: no swelling, no scarring, no overlying bruising or breaks in the skin, no obvious bony abnormalities, lateral patellar deviation Palpation: no TTP along medial or lateral joint lines, no TTP of pes anserine bursa or suprapatellar bursa, no patellar point tenderness ROM (passive): complete ROM in all direction, no patellar deviation in passive ROM ROM (active): complete ROM in all direction although notable/painless clunk heard/felt in knee going from complete knee flexion with externally rotated hip to full knee extension Special Tests: 2+ anterior drawer and Lachman's reveals ACL laxity (1+ on right), posterior drawer shows stable endpoint, stable varus and valgus stress, intact  McMurray's/Apley's/Thessaly's negative  Assessment & Plan:   Seher Schlagel is a 64 y.o. female presenting with 3 years of left knee pain and 3 weeks of right hip pain. Exam reveals notable painless clunk heard on extension of left knee with ACL laxity and intact patellar tracking suspicious for intra-articular subluxation. Right knee exam with predominant external rotator weakness and pain lopcated over posterior glute suggestive of piriformis syndrome with gluteus medius weakness.  1. Knee subluxation with instability -- home exercises to strengthen quad and hamstring muscles -- consider knee brace if feeling unstable  2. Piriformis syndrome -- home exercises to strengthen external rotators/abductors -- NSAIDS as needed for pain -- f/u in 6 weeks if not improved  Carolyne Littles MS4

## 2019-06-06 ENCOUNTER — Encounter: Payer: Self-pay | Admitting: Family Medicine

## 2019-06-19 ENCOUNTER — Encounter: Payer: Self-pay | Admitting: Sports Medicine

## 2019-06-19 ENCOUNTER — Ambulatory Visit (INDEPENDENT_AMBULATORY_CARE_PROVIDER_SITE_OTHER): Payer: 59 | Admitting: Sports Medicine

## 2019-06-19 ENCOUNTER — Other Ambulatory Visit: Payer: Self-pay

## 2019-06-19 ENCOUNTER — Ambulatory Visit
Admission: RE | Admit: 2019-06-19 | Discharge: 2019-06-19 | Disposition: A | Discharge status: Home / Self Care | Payer: 59 | Source: Ambulatory Visit | Attending: Sports Medicine | Admitting: Sports Medicine

## 2019-06-19 VITALS — BP 98/68 | Ht 64.0 in | Wt 160.0 lb

## 2019-06-19 DIAGNOSIS — M25551 Pain in right hip: Secondary | ICD-10-CM | POA: Diagnosis not present

## 2019-06-19 MED ORDER — GABAPENTIN 300 MG PO CAPS: 300.0000 mg | ORAL_CAPSULE | Freq: Every day | ORAL | 1 refills | Status: DC

## 2019-06-19 NOTE — Progress Notes (Addendum)
PCP: Renford Dills, MD  Subjective:   HPI: Patient is a 64 y.o. female with history of osteoporosis here for f/u of right hip pain.  Pain is still located in posterior glut over glut medius and piriformis however she additionally reports radiation of pain down leg across medial thigh and below knee cap. This radiating is described as throbbing and is more pronounced compared to initial visit 3 weeks ago. Symptoms are most severe when rising from sitting to standing or when reaching down to pick something off floor. She has attended formal PT once and performed daily home exercises for piriformis syndrome however has had worsening pain. Ibuprofen has provided less relief than before and she has since used a few of her husband's oxycodone which has provided moderate relief.   Past Medical History:  Diagnosis Date  . Abnormal glandular Papanicolaou smear of cervix 06/2005   followed by LEEP, + HR HPV DNA 06/2008  . Allergy   . Anemia 09/2004   iron deficient  . Anxiety   . Arthritis, hip   . Broken clavicle 10/23/2015   pt states she fell in bathroom   . Depression    Last psychiatrist - Dr. Melrose Nakayama - does not have records as seen prior to 2007  . Foot fracture, left 11/2006   5th MTP  . Migraine   . Osteoporosis 02/2006   DEXA 04/23/12 w/ T score L femur -2.8 and L spine -1.4  . Thyroid disease 01/2007   subclinical hyperthyroidism  . Von Willebrand's disease (HCC) 03/2005    Current Outpatient Medications on File Prior to Visit  Medication Sig Dispense Refill  . alendronate (FOSAMAX) 70 MG tablet Take 1 tablet (70 mg total) by mouth every 7 (seven) days. Take with a full glass of water on an empty stomach. 12 tablet 2  . ALPRAZolam (XANAX) 0.25 MG tablet Take 1 tablet (0.25 mg total) by mouth at bedtime as needed for anxiety. 30 tablet 0  . aspirin-acetaminophen-caffeine (EXCEDRIN MIGRAINE) 250-250-65 MG tablet Take 1 tablet by mouth every 8 (eight) hours as needed for headache or  migraine.     . Calcium-Vitamin D 600-200 MG-UNIT tablet Take 2 tablets by mouth daily.     . Cholecalciferol (VITAMIN D3) 5000 units CAPS Take 5,000 Units by mouth once a week.    . conjugated estrogens (PREMARIN) vaginal cream daily as needed.    . DUEXIS 800-26.6 MG TABS Take 1 tablet by mouth 3 (three) times daily.    Marland Kitchen FLUoxetine (PROZAC) 40 MG capsule Take 1 capsule (40 mg total) by mouth daily. 90 capsule 4  . glucosamine-chondroitin (GLUCOSAMINE-CHONDROITIN DS) 500-400 MG tablet Take 1 tablet by mouth 2 times daily at 12 noon and 4 pm.     . hydrocortisone-pramoxine (ANALPRAM-HC) 2.5-1 % rectal cream Place rectally 3 (three) times daily. Apply as needed. 30 g 0  . Magnesium Oxide 400 MG CAPS Take 2 capsules by mouth daily.     . methylphenidate (RITALIN) 20 MG tablet TK 1 T PO QAM.    . Multiple Vitamin tablet Take 1 tablet by mouth daily.     . Omega-3 1000 MG CAPS Take 1 g by mouth daily.     . valACYclovir (VALTREX) 500 MG tablet Take 1 tablet (500 mg total) by mouth 2 (two) times daily. Take BID x 3 days with symptoms. 30 tablet 1   No current facility-administered medications on file prior to visit.    Past Surgical History:  Procedure Laterality Date  .  COLONOSCOPY N/A 10/15/2012   Procedure: COLONOSCOPY;  Surgeon: Juanita Craver, MD;  Location: [facility];  Service: Endoscopy;  Laterality: N/A;  . COLPORRHAPHY  01/27/15   Posterior- [facility], Dr. Carlota Raspberry  . LEEP  2007  . LEEP N/A 05/06/2016   Procedure: LOOP ELECTROSURGICAL EXCISION PROCEDURE (LEEP) with colposcopy ;  Surgeon: Megan Salon, MD;  Location: [facility];  Service: Gynecology;  Laterality: N/A;  . TMJ ARTHROSCOPY  1996  . TONSILLECTOMY AND ADENOIDECTOMY      Allergies  Allergen Reactions  . Sulfamethoxazole-Trimethoprim Other (See Comments)    sick  . Macrobid [Nitrofurantoin] Nausea And Vomiting    Social History   Socioeconomic History  . Marital status: Married    Spouse name: Not  on file  . Number of children: Not on file  . Years of education: Not on file  . Highest education level: Not on file  Occupational History  . Not on file  Tobacco Use  . Smoking status: Former Smoker    Packs/day: 1.00    Years: 2.00    Pack years: 2.00    Types: Cigarettes    Quit date: 03/29/1999    Years since quitting: 20.2  . Smokeless tobacco: Never Used  Substance and Sexual Activity  . Alcohol use: Yes    Alcohol/week: 7.0 standard drinks    Types: 7 Glasses of wine per week  . Drug use: No  . Sexual activity: Yes    Birth control/protection: Post-menopausal  Other Topics Concern  . Not on file  Social History Narrative  . Not on file   Social Determinants of Health   Financial Resource Strain:   . Difficulty of Paying Living Expenses:   Food Insecurity:   . Worried About Charity fundraiser in the Last Year:   . Arboriculturist in the Last Year:   Transportation Needs:   . Film/video editor (Medical):   Marland Kitchen Lack of Transportation (Non-Medical):   Physical Activity:   . Days of Exercise per Week:   . Minutes of Exercise per Session:   Stress:   . Feeling of Stress :   Social Connections:   . Frequency of Communication with Friends and Family:   . Frequency of Social Gatherings with Friends and Family:   . Attends Religious Services:   . Active Member of Clubs or Organizations:   . Attends Archivist Meetings:   Marland Kitchen Marital Status:   Intimate Partner Violence:   . Fear of Current or Ex-Partner:   . Emotionally Abused:   Marland Kitchen Physically Abused:   . Sexually Abused:     Family History  Problem Relation Age of Onset  . Osteoporosis Mother   . Heart Problems Mother   . Thyroid disease Mother   . Cancer Father   . Ulcers Father   . Alcohol abuse Father   . Osteoporosis Maternal Grandmother   . Alzheimer's disease Maternal Grandfather   . Depression Daughter   . Psychiatric Illness Brother     BP 98/68   Ht 5\' 4"  (1.626 m)   Wt 160 lb  (72.6 kg)   LMP 02/17/2006   BMI 27.46 kg/m   Review of Systems: See HPI above.     Objective:  Physical Exam:  General: Alert and oriented x3, NAD, comfortable in exam room HEENT: normocephalic and atraumatic, EOMI, conjunctivae and sclerae clear, MMM, no erythema or exudates, trachea midline,  Lumbar Spine Inspection: no swelling, no scarring,  no overlying bruising or breaks in the skin, no obvious bony abnormalities Palpation: no midline tenderness of spinous processes, TTP over glut medius and external rotators ROM (passive): complete ROM in all directions ROM (active): complete ROM in all directions Strength: decreased right great toe dorsiflexion compared to contralateral side, Special Tests: negative straight leg test, positive "slump test" on right side with leg extended and and patient leaning forward with neck flexed Achilles reflex brisk and equal bilaterally Patellar reflex brisk and equal bilaterally   Right Hip Inspection: no swelling, no scarring, no overlying bruising or breaks in the skin, no obvious bony abnormalities Palpation: no TTP over trochanteric bursa, no pain over SI joints ROM (passive): complete ROM in all directions ROM (active): complete ROM in all directions Strength: strength of hips 5/5 in all directions without reproduction of pain Special Tests: positive FABIR, negative FADER  Assessment & Plan:   Haley Buchanan is a 64 y.o. female with history of osteoporosis presenting for f/u of right hip pain previously evaluated as piriformis syndrome with weakness of external rotators. Patient continues to have pain located over glut medius and piriformis but has had worsening of symptoms follow PT exercises to strengthen external rotators. She also endorses increasing radiating/throbbing pains that extend below the knee. Exam suggests possible L5 radiculopathy with weakness of right great toe dorsiflexon compared to contralateral side and  reproduction of pain with slump test.    1. Right Lumbar Radiculopathy -- gabapentin 300 mg -- x ray to evaluate for possible decreased disc space followed by MRI to evaluate for disc disease -- adjust PT regimen from external hip rotators to low back -- f/u with xray and mri results    Hilario Quarry MS4  I was the preceptor for this visit and available for immediate consultation to both the medical student and the sports medicine fellow. Marsa Aris, DO

## 2019-06-26 ENCOUNTER — Ambulatory Visit (INDEPENDENT_AMBULATORY_CARE_PROVIDER_SITE_OTHER): Payer: 59 | Admitting: Sports Medicine

## 2019-06-26 ENCOUNTER — Encounter: Payer: Self-pay | Admitting: Sports Medicine

## 2019-06-26 ENCOUNTER — Other Ambulatory Visit: Payer: Self-pay

## 2019-06-26 VITALS — BP 110/68 | Ht 64.5 in | Wt 155.8 lb

## 2019-06-26 DIAGNOSIS — M5416 Radiculopathy, lumbar region: Secondary | ICD-10-CM

## 2019-06-26 NOTE — Patient Instructions (Addendum)
We will refer to you Dr. Maurice Small to get a lumbar epidural steroid injection (ESI). He is located at: [facility] [address] [place] [phone]  Either our office or Dr. Marcy Siren office will call you with appt information. Please contact [facility] and get a copy of your MRI on a disc to bring to this appt.  You can increase your gabapentin to 2 300 mg capsules at nighttime. Try 1 300 mg capsule during to the day if it doesn't make you too sleepy.

## 2019-06-26 NOTE — Progress Notes (Addendum)
[facility] [address] [place], [place] [address] Phone: [phone] Fax: [phone]   Patient Name: Haley Buchanan Date of Birth: [birth year] Medical Record Number: [id number] Gender: female Date of Encounter: 06/26/2019  SUBJECTIVE:      Chief Complaint:  Follow-up back pain   HPI:  Arron is following up for low back pain with radiculopathy.  She had an MRI last week.  She says the gabapentin helps for overnight, but she is still having significant pain during the day.  She denies any new injury.     ROS:     See HPI.   PERTINENT  PMH / PSH / FH / SH:  Past Medical, Surgical, Social, and Family History Reviewed & Updated in the EMR.   OBJECTIVE:  BP 110/68   Ht 5' 4.5" (1.638 m)   Wt 155 lb 12.8 oz (70.7 kg)   LMP 02/17/2006   BMI 26.33 kg/m  Physical Exam:  Vital signs are reviewed.   GEN: Alert and oriented, NAD Pulm: Breathing unlabored PSY: normal mood, congruent affect  MSK: Back Exam:  Inspection: Unremarkable  Motion: Flexion 35 deg, Extension 45 deg, Side Bending to 45 deg bilaterally,  Rotation to 45 deg bilaterally  SLR laying: Positive Palpable tenderness: None. FABER: negative. Sensory change: Gross sensation intact to all lumbar and sacral dermatomes.  Reflexes: 2+ at both patellar tendons, 2+ at achilles tendons, Babinski's downgoing.  Strength at Feet  Plantar-flexion: 5/5 Dorsi-flexion: 5/5 Eversion: 5/5 Inversion: 5/5  Leg strength  Quad: 4/5 Hamstring: 5/5 Hip flexor: 4/5 Hip abductors: 5/5   Acute Interface, Incoming Rad Results - 06/21/2019 11:36 AM EDT MRI lumbar spine:  TECHNIQUE: Sagittal and axial T1 and T2-weighted sequences were performed. Additional sagittal STIR images were performed.  INDICATION: Right-sided radiculopathy  COMPARISON: None  FINDINGS: #   #  Vertebral body heights are well maintained.  #  Multiple hemangiomas largest L1. #  Conus terminates at L1 without  evidence of tethering. #  Nerve roots appear normal.  #  Incidental findings: None.   #  L1-2: Normal. #   #  L2-3: Mild facet arthropathy. Central canal is well-maintained. Neural foramina are patent. #   #  L3-4: Mild degenerative disc disease. There is facet arthropathy and disc bulge with a large inferiorly migrating disc extrusion severely narrowing the right lateral recess probably impinging upon the transversing right-sided nerve roots. There is mild central canal stenosis. #   #  L4-5: Mild degenerative disc disease. There is facet arthropathy with moderate size left and small right facet joint effusion. Disc bulge causes mild bilateral neural foraminal narrowing. No significant central canal stenosis. #   #  L5-S1: Moderate degenerative disc disease. 6 mm grade 1 anterior listhesis with bilateral L5 spondylolysis. Disc uncovering causes moderate bilateral neural foraminal narrowing. No significant central canal stenosis.   IMPRESSION:  1.  Lumbar spondylosis most significant at L3-4 with a large inferiorly migrating disc extrusion severely narrowing the right lateral recess probably impinging upon the transversing right sided nerve roots.  2.  L5-S1 grade 1 anterolisthesis with bilateral L5 spondylolysis. Disc uncovering causes moderate bilateral neural foraminal narrowing.  Electronically Signed by: Abner Greenspan   ASSESSMENT & PLAN:   1. Low back pain with radiculopathy  As evidenced by her MRI and consistent with her physical exam, she does have a large herniated disc at L3-L4.  There is also some and anterior listhesis at her lower lumbar spine.  I  will increase her gabapentin to 600 mg at night and She can trial 300 mg of gabapentin during the day.  I have given her a referral to physiatry for a ESI.  He already has physical therapy and can continue that during the injection series.  I will plan to see her back in 6 weeks   [place], DO, ATC Sports Medicine  Fellow   Addendum:  I was the preceptor for this visit and available for immediate consultation.  Karlton Lemon MD Kirt Boys

## 2019-06-27 ENCOUNTER — Encounter: Payer: Self-pay | Admitting: Family Medicine

## 2019-06-27 ENCOUNTER — Other Ambulatory Visit: Payer: Self-pay | Admitting: Sports Medicine

## 2019-06-27 DIAGNOSIS — M5416 Radiculopathy, lumbar region: Secondary | ICD-10-CM

## 2019-06-27 NOTE — Addendum Note (Signed)
Addended by: Rutha Bouchard E on: 06/27/2019 09:32 AM   Modules accepted: Orders

## 2019-07-02 ENCOUNTER — Telehealth: Payer: Self-pay

## 2019-07-02 MED ORDER — GABAPENTIN 300 MG PO CAPS: ORAL_CAPSULE | ORAL | 1 refills | Status: DC

## 2019-07-02 NOTE — Telephone Encounter (Signed)
Refill sent to pharmacy.   

## 2019-07-04 ENCOUNTER — Other Ambulatory Visit: Payer: Self-pay

## 2019-07-04 ENCOUNTER — Other Ambulatory Visit: Payer: Self-pay | Admitting: Sports Medicine

## 2019-07-04 ENCOUNTER — Ambulatory Visit
Admission: RE | Admit: 2019-07-04 | Discharge: 2019-07-04 | Disposition: A | Discharge status: Home / Self Care | Payer: 59 | Source: Ambulatory Visit | Attending: Sports Medicine | Admitting: Sports Medicine

## 2019-07-04 DIAGNOSIS — M5416 Radiculopathy, lumbar region: Secondary | ICD-10-CM

## 2019-07-04 MED ORDER — METHYLPREDNISOLONE ACETATE 40 MG/ML INJ SUSP (RADIOLOG
120.0000 mg | Freq: Once | INTRAMUSCULAR | Status: AC
  Administered 2019-07-04: 120 mg via EPIDURAL

## 2019-07-04 MED ORDER — IOPAMIDOL (ISOVUE-M 200) INJECTION 41%
1.0000 mL | Freq: Once | INTRAMUSCULAR | Status: AC
  Administered 2019-07-04: 09:00:00 1 mL via EPIDURAL

## 2019-07-04 NOTE — Discharge Instructions (Signed)

## 2019-07-17 ENCOUNTER — Other Ambulatory Visit: Payer: Self-pay

## 2019-07-17 ENCOUNTER — Encounter: Payer: Self-pay | Admitting: Family Medicine

## 2019-07-17 ENCOUNTER — Ambulatory Visit (INDEPENDENT_AMBULATORY_CARE_PROVIDER_SITE_OTHER): Payer: 59 | Admitting: Family Medicine

## 2019-07-17 VITALS — BP 104/68 | Ht 64.5 in | Wt 157.0 lb

## 2019-07-17 DIAGNOSIS — M5416 Radiculopathy, lumbar region: Secondary | ICD-10-CM

## 2019-07-17 NOTE — Patient Instructions (Signed)
We will set up a second injection for you. Restart physical therapy a week after the second injection. Continue the gabapentin. Follow up with [place] in about 1 month for reevaluation.

## 2019-07-17 NOTE — Progress Notes (Signed)
PCP: Seward Carol, MD  Subjective:   HPI: Patient is a 64 y.o. female here for right leg pain.  3/31: Patient reports she feels about 50% improved following the ESI of her back. Pain is more intermittent now but can still be quite severe. Has been 2 weeks since her ESI. Has been icing, taking gabapentin as needed (took 2 yesterday as pain was bad). Pain radiates below right knee. No bowel/bladder dysfunction.  Past Medical History:  Diagnosis Date  . Abnormal glandular Papanicolaou smear of cervix 06/2005   followed by LEEP, + HR HPV DNA 06/2008  . Allergy   . Anemia 09/2004   iron deficient  . Anxiety   . Arthritis, hip   . Broken clavicle 10/23/2015   pt states she fell in bathroom   . Depression    Last psychiatrist - Dr. Yves Dill - does not have records as seen prior to 2007  . Foot fracture, left 11/2006   5th MTP  . Migraine   . Osteoporosis 02/2006   DEXA 04/23/12 w/ T score L femur -2.8 and L spine -1.4  . Thyroid disease 01/2007   subclinical hyperthyroidism  . Von Willebrand's disease ([place]) 03/2005    Current Outpatient Medications on File Prior to Visit  Medication Sig Dispense Refill  . alendronate (FOSAMAX) 70 MG tablet Take 1 tablet (70 mg total) by mouth every 7 (seven) days. Take with a full glass of water on an empty stomach. 12 tablet 2  . ALPRAZolam (XANAX) 0.25 MG tablet Take 1 tablet (0.25 mg total) by mouth at bedtime as needed for anxiety. 30 tablet 0  . aspirin-acetaminophen-caffeine (EXCEDRIN MIGRAINE) 250-250-65 MG tablet Take 1 tablet by mouth every 8 (eight) hours as needed for headache or migraine.     . Calcium-Vitamin D 600-200 MG-UNIT tablet Take 2 tablets by mouth daily.     . Cholecalciferol (VITAMIN D3) 5000 units CAPS Take 5,000 Units by mouth once a week.    . conjugated estrogens (PREMARIN) vaginal cream daily as needed.    . DUEXIS 800-26.6 MG TABS Take 1 tablet by mouth 3 (three) times daily.    Marland Kitchen FLUoxetine (PROZAC) 40 MG capsule  Take 1 capsule (40 mg total) by mouth daily. 90 capsule 4  . gabapentin (NEURONTIN) 300 MG capsule Take 2 capsules at bedtime. Take 1 additional capsule during the day if needed. 90 capsule 1  . glucosamine-chondroitin (GLUCOSAMINE-CHONDROITIN DS) 500-400 MG tablet Take 1 tablet by mouth 2 times daily at 12 noon and 4 pm.     . hydrocortisone-pramoxine (ANALPRAM-HC) 2.5-1 % rectal cream Place rectally 3 (three) times daily. Apply as needed. 30 g 0  . Magnesium Oxide 400 MG CAPS Take 2 capsules by mouth daily.     . methylphenidate (RITALIN) 20 MG tablet TK 1 T PO QAM.    . Multiple Vitamin tablet Take 1 tablet by mouth daily.     . Omega-3 1000 MG CAPS Take 1 g by mouth daily.     . valACYclovir (VALTREX) 500 MG tablet Take 1 tablet (500 mg total) by mouth 2 (two) times daily. Take BID x 3 days with symptoms. 30 tablet 1   No current facility-administered medications on file prior to visit.    Past Surgical History:  Procedure Laterality Date  . COLONOSCOPY N/A 10/15/2012   Procedure: COLONOSCOPY;  Surgeon: Juanita Craver, MD;  Location: [facility];  Service: Endoscopy;  Laterality: N/A;  . COLPORRHAPHY  01/27/15   Posterior- [facility], Dr.  Badlani  . LEEP  2007  . LEEP N/A 05/06/2016   Procedure: LOOP ELECTROSURGICAL EXCISION PROCEDURE (LEEP) with colposcopy ;  Surgeon: Jerene Bears, MD;  Location: [facility];  Service: Gynecology;  Laterality: N/A;  . TMJ ARTHROSCOPY  1996  . TONSILLECTOMY AND ADENOIDECTOMY      Allergies  Allergen Reactions  . Sulfamethoxazole-Trimethoprim Other (See Comments)    sick  . Macrobid [Nitrofurantoin] Nausea And Vomiting    Social History   Socioeconomic History  . Marital status: Married    Spouse name: Not on file  . Number of children: Not on file  . Years of education: Not on file  . Highest education level: Not on file  Occupational History  . Not on file  Tobacco Use  . Smoking status: Former Smoker    Packs/day: 1.00     Years: 2.00    Pack years: 2.00    Types: Cigarettes    Quit date: 03/29/1999    Years since quitting: 20.3  . Smokeless tobacco: Never Used  Substance and Sexual Activity  . Alcohol use: Yes    Alcohol/week: 7.0 standard drinks    Types: 7 Glasses of wine per week  . Drug use: No  . Sexual activity: Yes    Birth control/protection: Post-menopausal  Other Topics Concern  . Not on file  Social History Narrative  . Not on file   Social Determinants of Health   Financial Resource Strain:   . Difficulty of Paying Living Expenses:   Food Insecurity:   . Worried About Programme researcher, broadcasting/film/video in the Last Year:   . Barista in the Last Year:   Transportation Needs:   . Freight forwarder (Medical):   Marland Kitchen Lack of Transportation (Non-Medical):   Physical Activity:   . Days of Exercise per Week:   . Minutes of Exercise per Session:   Stress:   . Feeling of Stress :   Social Connections:   . Frequency of Communication with Friends and Family:   . Frequency of Social Gatherings with Friends and Family:   . Attends Religious Services:   . Active Member of Clubs or Organizations:   . Attends Banker Meetings:   Marland Kitchen Marital Status:   Intimate Partner Violence:   . Fear of Current or Ex-Partner:   . Emotionally Abused:   Marland Kitchen Physically Abused:   . Sexually Abused:     Family History  Problem Relation Age of Onset  . Osteoporosis Mother   . Heart Problems Mother   . Thyroid disease Mother   . Cancer Father   . Ulcers Father   . Alcohol abuse Father   . Osteoporosis Maternal Grandmother   . Alzheimer's disease Maternal Grandfather   . Depression Daughter   . Psychiatric Illness Brother     BP 104/68   Ht 5' 4.5" (1.638 m)   Wt 157 lb (71.2 kg)   LMP 02/17/2006   BMI 26.53 kg/m   Review of Systems: See HPI above.     Objective:  Physical Exam:  Gen: NAD, comfortable in exam room  Back: No gross deformity, scoliosis. TTP right lumbar paraspinal  region, gluteal musculature.  No midline or bony TTP. Strength LEs 5/5 all muscle groups.   2+ MSRs in patellar and achilles tendons, equal bilaterally. Negative SLRs. Sensation intact to light touch bilaterally. Negative logroll bilateral hips   Assessment & Plan:  1. Right lumbar radiculopathy - Large disc  extrusion L3-4 on right as cause of her radicular pain.  Improved with ESI.  PT has been on hold.  Discussed options - she would like to repeat the ESI so we will set this up.  Plan to restart PT about a week after 2nd injection.  Continue gabapentin.  F/u in 1 month.

## 2019-07-18 ENCOUNTER — Other Ambulatory Visit: Payer: Self-pay | Admitting: Family Medicine

## 2019-07-18 DIAGNOSIS — M5416 Radiculopathy, lumbar region: Secondary | ICD-10-CM

## 2019-07-31 ENCOUNTER — Other Ambulatory Visit: Payer: Self-pay

## 2019-07-31 ENCOUNTER — Ambulatory Visit
Admission: RE | Admit: 2019-07-31 | Discharge: 2019-07-31 | Disposition: A | Discharge status: Home / Self Care | Payer: 59 | Source: Ambulatory Visit | Attending: Family Medicine | Admitting: Family Medicine

## 2019-07-31 DIAGNOSIS — M5416 Radiculopathy, lumbar region: Secondary | ICD-10-CM

## 2019-07-31 MED ORDER — IOPAMIDOL (ISOVUE-M 200) INJECTION 41%
1.0000 mL | Freq: Once | INTRAMUSCULAR | Status: AC
  Administered 2019-07-31: 1 mL via EPIDURAL

## 2019-07-31 MED ORDER — METHYLPREDNISOLONE ACETATE 40 MG/ML INJ SUSP (RADIOLOG
120.0000 mg | Freq: Once | INTRAMUSCULAR | Status: AC
  Administered 2019-07-31: 120 mg via EPIDURAL

## 2019-07-31 NOTE — Discharge Instructions (Signed)

## 2019-09-18 ENCOUNTER — Other Ambulatory Visit: Payer: Self-pay

## 2019-09-18 ENCOUNTER — Ambulatory Visit
Admission: RE | Admit: 2019-09-18 | Discharge: 2019-09-18 | Disposition: A | Discharge status: Home / Self Care | Payer: 59 | Source: Ambulatory Visit | Attending: Obstetrics & Gynecology | Admitting: Obstetrics & Gynecology

## 2019-09-18 DIAGNOSIS — M810 Age-related osteoporosis without current pathological fracture: Secondary | ICD-10-CM

## 2019-10-28 DIAGNOSIS — M4317 Spondylolisthesis, lumbosacral region: Secondary | ICD-10-CM | POA: Insufficient documentation

## 2019-10-28 DIAGNOSIS — M5126 Other intervertebral disc displacement, lumbar region: Secondary | ICD-10-CM | POA: Insufficient documentation

## 2019-10-29 ENCOUNTER — Other Ambulatory Visit: Payer: Self-pay

## 2019-10-29 MED ORDER — GABAPENTIN 300 MG PO CAPS: ORAL_CAPSULE | ORAL | 2 refills | Status: AC

## 2019-10-29 NOTE — Progress Notes (Signed)
Pt called asking for a gabapentin refill. Same pharmacy. She saw a back surgeon yesterday who told her to increase Gabapentin to 600mg  three times a day. Refill sent to pharmacy with new instructions. Pt is aware.

## 2019-11-13 ENCOUNTER — Other Ambulatory Visit: Payer: Self-pay | Admitting: Geriatric Medicine

## 2019-11-13 ENCOUNTER — Ambulatory Visit
Admission: RE | Admit: 2019-11-13 | Discharge: 2019-11-13 | Disposition: A | Discharge status: Home / Self Care | Payer: 59 | Source: Ambulatory Visit | Attending: Geriatric Medicine | Admitting: Geriatric Medicine

## 2019-11-13 DIAGNOSIS — R0789 Other chest pain: Secondary | ICD-10-CM

## 2019-11-23 ENCOUNTER — Other Ambulatory Visit: Payer: Self-pay | Admitting: Obstetrics & Gynecology

## 2019-11-25 NOTE — Telephone Encounter (Signed)
Medication refill request: Fosamax  Last AEX:  10-30-2018 SM  Next AEX: 01-10-20  Last BMD: 09-18-19 osteoporosis. Stable Refill authorized: Today, please advise.   Medication pended for #12, 0RF. Please refill if appropriate.

## 2020-01-06 NOTE — Progress Notes (Signed)
64 y.o. [id number] Married White or Caucasian female here for AEX.  Doing well.  Moved to a new neighborhood last October.  Moved to [place].  She is working remotely now.  She does go into the office on occasion but this is not required.    Has microdiscectomy scheduled 01/22/2020 with Dr. Dorna Leitz.  Has been having a lot of sciatic nerve pain.  This is in [place].    Denies vaginal bleeding.    Health Maintenance: PCP:  Renford Dills  Last wellness appt was this year  Did blood work at that appt: yes   Vaccines are up to date:  Aware pneumov Colonoscopy:  10-15-12 normal f/u 1yrs.  Pt is aware this is due.  Is going to recover from her upcoming back surgery to have this done.   MMG:  12-20-2018 category c density birads 1:neg BMD:  09-13-17 osteoporosis, T score was stable at -3.0 Last pap smear:  08-13-2017 neg HPV HR neg, 10-30-2018 neg HPV HR neg H/o abnormal pap smear:  LEEP   reports that she quit smoking about 20 years ago. Her smoking use included cigarettes. She has a 2.00 pack-year smoking history. She has never used smokeless tobacco. She reports current alcohol use of about 7.0 standard drinks of alcohol per week. She reports that she does not use drugs.  Past Medical History:  Diagnosis Date  . Abnormal glandular Papanicolaou smear of cervix 06/2005   followed by LEEP, + HR HPV DNA 06/2008  . Allergy   . Anemia 09/2004   iron deficient  . Anxiety   . Arthritis, hip   . Broken clavicle 10/23/2015   pt states she fell in bathroom   . Depression    Last psychiatrist - Dr. Melrose Nakayama - does not have records as seen prior to 2007  . Foot fracture, left 11/2006   5th MTP  . Lumbar herniated disc   . Migraine   . Osteoporosis 02/2006   DEXA 04/23/12 w/ T score L femur -2.8 and L spine -1.4  . Thyroid disease 01/2007   subclinical hyperthyroidism  . Von Willebrand's disease ([facility]) 03/2005    Past Surgical History:  Procedure Laterality Date  . COLONOSCOPY N/A 10/15/2012    Procedure: COLONOSCOPY;  Surgeon: Charna Elizabeth, MD;  Location: [facility];  Service: Endoscopy;  Laterality: N/A;  . COLPORRHAPHY  01/27/15   Posterior- [facility], Dr. Gala Lewandowsky  . LEEP  2007  . LEEP N/A 05/06/2016   Procedure: LOOP ELECTROSURGICAL EXCISION PROCEDURE (LEEP) with colposcopy ;  Surgeon: Jerene Bears, MD;  Location: [facility];  Service: Gynecology;  Laterality: N/A;  . TMJ ARTHROSCOPY  1996  . TONSILLECTOMY AND ADENOIDECTOMY      Current Outpatient Medications  Medication Sig Dispense Refill  . alendronate (FOSAMAX) 70 MG tablet TAKE 1 TABLET BY MOUTH  EVERY 7 DAYS WITH A FULL  GLASS OF WATER ON AN EMPTY  STOMACH 12 tablet 0  . ALPRAZolam (XANAX) 0.25 MG tablet Take 1 tablet (0.25 mg total) by mouth at bedtime as needed for anxiety. 30 tablet 0  . aspirin-acetaminophen-caffeine (EXCEDRIN MIGRAINE) 250-250-65 MG tablet Take 1 tablet by mouth every 8 (eight) hours as needed for headache or migraine.     . conjugated estrogens (PREMARIN) vaginal cream daily as needed.    Marland Kitchen FLUoxetine (PROZAC) 40 MG capsule Take 1 capsule (40 mg total) by mouth daily. 90 capsule 4  . gabapentin (NEURONTIN) 300 MG capsule Take 2 capsules (600 mg) 3  times a day. 180 capsule 2  . glucosamine-chondroitin (GLUCOSAMINE-CHONDROITIN DS) 500-400 MG tablet Take 1 tablet by mouth 2 times daily at 12 noon and 4 pm.     . hydrocortisone-pramoxine (ANALPRAM-HC) 2.5-1 % rectal cream Place rectally 3 (three) times daily. Apply as needed. 30 g 0  . ibuprofen (ADVIL) 800 MG tablet Take 800 mg by mouth 3 (three) times daily.    . methylphenidate (RITALIN) 20 MG tablet TK 1 T PO QAM.    . Multiple Vitamin tablet Take 1 tablet by mouth daily.     . Pramoxine-HC (HYDROCORTISONE ACE-PRAMOXINE) 2.5-1 % CREA Place rectally.    . valACYclovir (VALTREX) 500 MG tablet Take 1 tablet (500 mg total) by mouth 2 (two) times daily. Take BID x 3 days with symptoms. 30 tablet 1   No current facility-administered  medications for this visit.    Family History  Problem Relation Age of Onset  . Osteoporosis Mother   . Heart Problems Mother   . Thyroid disease Mother   . Cancer Father   . Ulcers Father   . Alcohol abuse Father   . Osteoporosis Maternal Grandmother   . Alzheimer's disease Maternal Grandfather   . Depression Daughter   . Psychiatric Illness Brother     Review of Systems  Constitutional: Negative.   HENT: Negative.   Eyes: Negative.   Respiratory: Negative.   Cardiovascular: Negative.   Gastrointestinal: Negative.   Endocrine: Negative.   Genitourinary: Negative.   Musculoskeletal: Negative.   Skin: Negative.   Allergic/Immunologic: Negative.   Neurological: Negative.   Hematological: Negative.   Psychiatric/Behavioral: Negative.     Exam:   BP 106/64   Pulse 68   Resp 16   Ht 5' 3.75" (1.619 m)   Wt 170 lb (77.1 kg)   LMP 02/17/2006   BMI 29.41 kg/m   Height: 5' 3.75" (161.9 cm)  General appearance: alert, cooperative and appears stated age Breasts: normal appearance, no masses or tenderness Abdomen: soft, non-tender; bowel sounds normal; no masses,  no organomegaly Lymph nodes: Cervical, supraclavicular, and axillary nodes normal.  No abnormal inguinal nodes palpated Neurologic: Grossly normal  Pelvic: External genitalia:  no lesions              Urethra:  normal appearing urethra with no masses, tenderness or lesions              Bartholins and Skenes: normal                 Vagina: normal appearing vagina with normal color and discharge, no lesions              Cervix: no lesions and cervix flattened from prior procedures              Pap taken: Yes.   Bimanual Exam:  Uterus:  normal size, contour, position, consistency, mobility, non-tender              Adnexa: not evaluated               Rectovaginal: Confirms               Anus:  normal sphincter tone, no lesions  Chaperone, Cornelia Copa, CMA, was present for exam.  A:  Well woman exam, normal  findings H/o vaginal atrophy Long hx of abnormal pap smears s/p LEEP 2/18 with high grade findings (neg pap with neg HR HPV 2019 and 2020) H/o depression H/o HSV, uses Valtrex with outbreaks Osteoporosis  Upcoming back surgery H/o Von Willebrand disesae  P:   Mammogram guidelines reviewed.  Doing yearly at this time. pap smear with HR HPV obtained today Colonoscopy due this year.  Pt aware.  Going to proceed with back surgery first and then will have colonoscopy completed RF for alendronate 70mg , once weekly.  #12/4RF Consider BMD with next MMG Rx for valtrex 500mg  bid x 3 days with symptoms to pharmacy. Vaccines reviewed.  She is aware I do not have documentation about pneumovax.  Order given to be completed if has not had done. Return annually or prn

## 2020-01-10 ENCOUNTER — Other Ambulatory Visit: Payer: Self-pay

## 2020-01-10 ENCOUNTER — Ambulatory Visit (INDEPENDENT_AMBULATORY_CARE_PROVIDER_SITE_OTHER): Payer: 59 | Admitting: Obstetrics & Gynecology

## 2020-01-10 ENCOUNTER — Encounter: Payer: Self-pay | Admitting: Obstetrics & Gynecology

## 2020-01-10 ENCOUNTER — Other Ambulatory Visit (HOSPITAL_COMMUNITY)
Admission: RE | Admit: 2020-01-10 | Discharge: 2020-01-10 | Disposition: A | Discharge status: Home / Self Care | Payer: 59 | Source: Ambulatory Visit | Attending: Obstetrics & Gynecology | Admitting: Obstetrics & Gynecology

## 2020-01-10 VITALS — BP 106/64 | HR 68 | Resp 16 | Ht 63.75 in | Wt 170.0 lb

## 2020-01-10 DIAGNOSIS — Z01419 Encounter for gynecological examination (general) (routine) without abnormal findings: Secondary | ICD-10-CM | POA: Diagnosis not present

## 2020-01-10 DIAGNOSIS — N871 Moderate cervical dysplasia: Secondary | ICD-10-CM

## 2020-01-10 MED ORDER — ALENDRONATE SODIUM 70 MG PO TABS: ORAL_TABLET | ORAL | 4 refills | Status: DC

## 2020-01-10 MED ORDER — VALACYCLOVIR HCL 500 MG PO TABS: 500.0000 mg | ORAL_TABLET | Freq: Two times a day (BID) | ORAL | 1 refills | Status: DC

## 2020-01-10 NOTE — Patient Instructions (Signed)
Pneumovax -- pneumonia vaccine.  Just one time.  Shingrix--shingles.  Two shot series.  Between 2-6 months between injections.    Influenza vaccine.  Pfizer Covid booster whenever you are ready.

## 2020-01-15 LAB — CYTOLOGY - PAP
Comment: NEGATIVE
Diagnosis: REACTIVE
High risk HPV: NEGATIVE

## 2020-02-19 ENCOUNTER — Other Ambulatory Visit: Payer: Self-pay | Admitting: Obstetrics & Gynecology

## 2020-02-19 DIAGNOSIS — Z1231 Encounter for screening mammogram for malignant neoplasm of breast: Secondary | ICD-10-CM

## 2020-04-08 ENCOUNTER — Ambulatory Visit: Payer: 59

## 2020-04-10 ENCOUNTER — Other Ambulatory Visit: Payer: Self-pay

## 2020-04-10 ENCOUNTER — Ambulatory Visit
Admission: RE | Admit: 2020-04-10 | Discharge: 2020-04-10 | Disposition: A | Discharge status: Home / Self Care | Payer: No Typology Code available for payment source | Source: Ambulatory Visit | Attending: Obstetrics & Gynecology | Admitting: Obstetrics & Gynecology

## 2020-04-10 DIAGNOSIS — Z1231 Encounter for screening mammogram for malignant neoplasm of breast: Secondary | ICD-10-CM

## 2021-01-01 ENCOUNTER — Other Ambulatory Visit (HOSPITAL_BASED_OUTPATIENT_CLINIC_OR_DEPARTMENT_OTHER): Payer: Self-pay | Admitting: Obstetrics & Gynecology

## 2021-01-15 ENCOUNTER — Encounter (HOSPITAL_BASED_OUTPATIENT_CLINIC_OR_DEPARTMENT_OTHER): Payer: Self-pay | Admitting: Obstetrics & Gynecology

## 2021-01-15 ENCOUNTER — Ambulatory Visit: Payer: No Typology Code available for payment source | Attending: Internal Medicine

## 2021-01-15 ENCOUNTER — Other Ambulatory Visit (HOSPITAL_BASED_OUTPATIENT_CLINIC_OR_DEPARTMENT_OTHER): Payer: Self-pay

## 2021-01-15 ENCOUNTER — Other Ambulatory Visit: Payer: Self-pay

## 2021-01-15 ENCOUNTER — Ambulatory Visit (INDEPENDENT_AMBULATORY_CARE_PROVIDER_SITE_OTHER): Payer: No Typology Code available for payment source | Admitting: Obstetrics & Gynecology

## 2021-01-15 VITALS — BP 116/66 | HR 80 | Ht 64.0 in | Wt 156.2 lb

## 2021-01-15 DIAGNOSIS — Z01419 Encounter for gynecological examination (general) (routine) without abnormal findings: Secondary | ICD-10-CM

## 2021-01-15 DIAGNOSIS — M816 Localized osteoporosis [Lequesne]: Secondary | ICD-10-CM

## 2021-01-15 DIAGNOSIS — N952 Postmenopausal atrophic vaginitis: Secondary | ICD-10-CM

## 2021-01-15 DIAGNOSIS — M810 Age-related osteoporosis without current pathological fracture: Secondary | ICD-10-CM

## 2021-01-15 DIAGNOSIS — Z9889 Other specified postprocedural states: Secondary | ICD-10-CM

## 2021-01-15 DIAGNOSIS — Z8659 Personal history of other mental and behavioral disorders: Secondary | ICD-10-CM

## 2021-01-15 DIAGNOSIS — D6800 Von Willebrand disease, unspecified: Secondary | ICD-10-CM

## 2021-01-15 DIAGNOSIS — R151 Fecal smearing: Secondary | ICD-10-CM

## 2021-01-15 DIAGNOSIS — Z23 Encounter for immunization: Secondary | ICD-10-CM

## 2021-01-15 DIAGNOSIS — B009 Herpesviral infection, unspecified: Secondary | ICD-10-CM

## 2021-01-15 MED ORDER — PFIZER COVID-19 VAC BIVALENT 30 MCG/0.3ML IM SUSP
INTRAMUSCULAR | 0 refills | Status: DC
  Filled 2021-01-15: qty 0.3, 1d supply, fill #0

## 2021-01-15 MED ORDER — PREVNAR 20 0.5 ML IM SUSY
PREFILLED_SYRINGE | INTRAMUSCULAR | 0 refills | Status: DC
  Filled 2021-01-15: qty 0.5, 1d supply, fill #0

## 2021-01-15 MED ORDER — ALENDRONATE SODIUM 70 MG PO TABS: ORAL_TABLET | ORAL | 4 refills | Status: DC

## 2021-01-15 NOTE — Progress Notes (Signed)
   Covid-19 Vaccination Clinic  Name:  Haley Buchanan    MRN: [id number] DOB: [birth year]  01/15/2021  [name] was observed post Covid-19 immunization for 15 minutes without incident. She was provided with Vaccine Information Sheet and instruction to access the V-Safe system.   [name] was instructed to call 911 with any severe reactions post vaccine: Difficulty breathing  Swelling of face and throat  A fast heartbeat  A bad rash all over body  Dizziness and weakness   Immunizations Administered     Name Date Dose VIS Date Route   Pfizer Covid-19 Vaccine Bivalent Booster 01/15/2021  9:34 AM 0.3 mL 11/25/2020 Intramuscular   Manufacturer: [employer], [employer]   Lot: [id number]   NDC: [phone]

## 2021-01-15 NOTE — Patient Instructions (Signed)
Pneumovax was 01/23/2019

## 2021-01-15 NOTE — Progress Notes (Signed)
65 y.o. [id number] Married White or Caucasian female here for wellness gyn exam.  Denies vaginal bleeding.  Back has done really well.  Continues to have issues with fecal smearing.  We discussed pelvic PT In the past.  Would like referral now.  Patient's last menstrual period was 02/17/2006.           Health Maintenance: PCP:  Dr. Claudette Laws.  Last wellness appt was 11/28/2020.  Did blood work at that appt:  yes Vaccines are up to date:  discussed with pt today Colonoscopy:  04/18/2020, Dr. Loreta Ave.  Follow up 5 years.  Condyloma noted.   MMG:  04/10/2020 Negative BMD:  09/18/2019 Last pap smear:  01/10/2020 Negative.   H/o abnormal pap smear:  LEEP 04/2016, paps neg with neg HR HPV 2019, 2020, 2021   reports that she quit smoking about 21 years ago. Her smoking use included cigarettes. She has a 2.00 pack-year smoking history. She has never used smokeless tobacco. She reports current alcohol use of about 7.0 standard drinks per week. She reports that she does not use drugs.  Past Medical History:  Diagnosis Date   Abnormal glandular Papanicolaou smear of cervix 06/2005   followed by LEEP, + HR HPV DNA 06/2008   Allergy    Anemia 09/2004   iron deficient   Anxiety    Arthritis, hip    Broken clavicle 10/23/2015   pt states she fell in bathroom    Depression    Last psychiatrist - Dr. Melrose Nakayama - does not have records as seen prior to 2007   Foot fracture, left 11/2006   5th MTP   Lumbar herniated disc    Migraine    Osteoporosis 02/2006   DEXA 04/23/12 w/ T score L femur -2.8 and L spine -1.4   Thyroid disease 01/2007   subclinical hyperthyroidism   Von Willebrand's disease 03/2005    Past Surgical History:  Procedure Laterality Date   COLONOSCOPY N/A 10/15/2012   Procedure: COLONOSCOPY;  Surgeon: Charna Elizabeth, MD;  Location: [facility];  Service: Endoscopy;  Laterality: N/A;   COLPORRHAPHY  01/27/15   Posterior- [facility], Dr. Gala Lewandowsky   LEEP  2007   LEEP N/A 05/06/2016   Procedure: LOOP  ELECTROSURGICAL EXCISION PROCEDURE (LEEP) with colposcopy ;  Surgeon: Jerene Bears, MD;  [facility];  Service: Gynecology;  Laterality: N/A;   TMJ ARTHROSCOPY  1996   TONSILLECTOMY AND ADENOIDECTOMY      Current Outpatient Medications  Medication Sig Dispense Refill   alendronate (FOSAMAX) 70 MG tablet TAKE 1 TABLET BY MOUTH  EVERY 7 DAYS WITH A FULL  GLASS OF WATER ON AN EMPTY  STOMACH 12 tablet 4   ALPRAZolam (XANAX) 0.25 MG tablet Take 1 tablet (0.25 mg total) by mouth at bedtime as needed for anxiety. 30 tablet 0   aspirin-acetaminophen-caffeine (EXCEDRIN MIGRAINE) 250-250-65 MG tablet Take 1 tablet by mouth every 8 (eight) hours as needed for headache or migraine.      conjugated estrogens (PREMARIN) vaginal cream daily as needed.     FLUoxetine (PROZAC) 40 MG capsule Take 1 capsule (40 mg total) by mouth daily. 90 capsule 4   gabapentin (NEURONTIN) 300 MG capsule Take 2 capsules (600 mg) 3 times a day. 180 capsule 2   glucosamine-chondroitin (GLUCOSAMINE-CHONDROITIN DS) 500-400 MG tablet Take 1 tablet by mouth 2 times daily at 12 noon and 4 pm.      hydrocortisone-pramoxine (ANALPRAM-HC) 2.5-1 % rectal cream Place rectally 3 (three)  times daily. Apply as needed. 30 g 0   ibuprofen (ADVIL) 800 MG tablet Take 800 mg by mouth 3 (three) times daily.     methylphenidate (RITALIN) 20 MG tablet TK 1 T PO QAM.     Multiple Vitamin tablet Take 1 tablet by mouth daily.      Pramoxine-HC (HYDROCORTISONE ACE-PRAMOXINE) 2.5-1 % CREA Place rectally.     valACYclovir (VALTREX) 500 MG tablet Take 1 tablet (500 mg total) by mouth 2 (two) times daily. Take BID x 3 days with symptoms. 30 tablet 1   No current facility-administered medications for this visit.    Family History  Problem Relation Age of Onset   Osteoporosis Mother    Heart Problems Mother    Thyroid disease Mother    Cancer Father    Ulcers Father    Alcohol abuse Father    Osteoporosis Maternal Grandmother     Alzheimer's disease Maternal Grandfather    Depression Daughter    Psychiatric Illness Brother     Review of Systems  All other systems reviewed and are negative.  Exam:   BP 116/66 (BP Location: Left Arm, Patient Position: Sitting, Cuff Size: Normal)   Pulse 80   Ht 5\' 4"  (1.626 m)   Wt 156 lb 3.2 oz (70.9 kg)   LMP 02/17/2006   BMI 26.81 kg/m   Height: 5\' 4"  (162.6 cm)  General appearance: alert, cooperative and appears stated age Breasts: normal appearance, no masses or tenderness Abdomen: soft, non-tender; bowel sounds normal; no masses,  no organomegaly Lymph nodes: Cervical, supraclavicular, and axillary nodes normal.  No abnormal inguinal nodes palpated Neurologic: Grossly normal  Pelvic: External genitalia:  no lesions              Urethra:  normal appearing urethra with no masses, tenderness or lesions              Bartholins and Skenes: normal                 Vagina: normal appearing vagina with atrophic changes and no discharge, no lesions              Cervix: no lesions              Pap taken: No. Bimanual Exam:  Uterus:  normal size, contour, position, consistency, mobility, non-tender              Adnexa: normal adnexa and no mass, fullness, tenderness               Rectovaginal: Confirms               Anus:  normal sphincter tone, no lesions  Chaperone, 02/19/2006, CMA, was present for exam.  Assessment/Plan: 1. Well woman exam with routine gynecological exam - pap not indicated today - MMG 03/2020 - colonoscopy 04/18/2020 - BMD due next year - lab work done with PCP - care gaps reviewed/updated  2. Age-related osteoporosis without current pathological fracture - alendronate (FOSAMAX) 70 MG tablet; TAKE 1 TABLET BY MOUTH  EVERY 7 DAYS WITH A FULL  GLASS OF WATER ON AN EMPTY  STOMACH  Dispense: 12 tablet; Refill: 4 - DG BONE DENSITY (DXA); Future.  Will repeat BMD next year.  3. Fecal smearing - Ambulatory referral to Physical Therapy  4. H/O  LEEP - h/o LEEP in 2010 and then 2/18 with high grade CIN noted.  Follow up pap smear neg with neg HR HPV 2019, 2020, 2021.  5. History of depression - on fluoxetine  6. HSV infection - does not need valtrex rx  7. Localized osteoporosis without current pathological fracture  8. Von Willebrand disease  9. Vaginal atrophy - does not need estrogen cream RF

## 2021-01-16 DIAGNOSIS — B009 Herpesviral infection, unspecified: Secondary | ICD-10-CM | POA: Insufficient documentation

## 2021-01-16 DIAGNOSIS — Z8659 Personal history of other mental and behavioral disorders: Secondary | ICD-10-CM | POA: Insufficient documentation

## 2021-01-16 DIAGNOSIS — R151 Fecal smearing: Secondary | ICD-10-CM | POA: Insufficient documentation

## 2021-01-16 DIAGNOSIS — Z9889 Other specified postprocedural states: Secondary | ICD-10-CM | POA: Insufficient documentation

## 2021-01-21 ENCOUNTER — Other Ambulatory Visit: Payer: Self-pay

## 2021-01-21 ENCOUNTER — Encounter: Payer: Self-pay | Admitting: Physical Therapy

## 2021-01-21 ENCOUNTER — Ambulatory Visit: Payer: No Typology Code available for payment source | Attending: Obstetrics & Gynecology | Admitting: Physical Therapy

## 2021-01-21 DIAGNOSIS — R293 Abnormal posture: Secondary | ICD-10-CM | POA: Diagnosis present

## 2021-01-21 DIAGNOSIS — M6281 Muscle weakness (generalized): Secondary | ICD-10-CM | POA: Diagnosis present

## 2021-01-21 DIAGNOSIS — R151 Fecal smearing: Secondary | ICD-10-CM | POA: Insufficient documentation

## 2021-01-21 NOTE — Therapy (Signed)
[facility] @ [facility] [address] [place], [place], [address] Phone: [phone]   Fax:  [phone]  Physical Therapy Evaluation  Patient Details  Name: Haley Buchanan MRN: [id number] Date of Birth: 04-Aug-1955 Referring Provider (PT): Jerene Bears, MD   Encounter Date: 01/21/2021   PT End of Session - 01/21/21 0758     Visit Number 1    Date for PT Re-Evaluation 04/15/21    Authorization Type UHC    PT Start Time 0758    PT Stop Time 0835    PT Time Calculation (min) 37 min    Activity Tolerance Patient tolerated treatment well    Behavior During Therapy [facility] for tasks assessed/performed             Past Medical History:  Diagnosis Date   Abnormal glandular Papanicolaou smear of cervix 06/2005   followed by LEEP, + HR HPV DNA 06/2008   Allergy    Anemia 09/2004   iron deficient   Anxiety    Arthritis, hip    Broken clavicle 10/23/2015   pt states she fell in bathroom    Depression    Last psychiatrist - Dr. Melrose Nakayama - does not have records as seen prior to 2007   Foot fracture, left 11/2006   5th MTP   Lumbar herniated disc    Migraine    Osteoporosis 02/2006   DEXA 04/23/12 w/ T score L femur -2.8 and L spine -1.4   Thyroid disease 01/2007   subclinical hyperthyroidism   Von Willebrand's disease 03/2005    Past Surgical History:  Procedure Laterality Date   COLONOSCOPY N/A 10/15/2012   Procedure: COLONOSCOPY;  Surgeon: Charna Elizabeth, MD;  Location: [facility];  Service: Endoscopy;  Laterality: N/A;   COLPORRHAPHY  01/27/15   [place]osterior- [facility], Dr. Gala Lewandowsky   LEEP  2007   LEEP N/A 05/06/2016   Procedure: LOOP ELECTROSURGICAL EXCISION PROCEDURE (LEEP) with colposcopy ;  Surgeon: Jerene Bears, MD;  Location: [facility];  Service: Gynecology;  Laterality: N/A;   TMJ ARTHROSCOPY  1996   TONSILLECTOMY AND ADENOIDECTOMY      There were no vitals filed for this visit.    Subjective  Assessment - 01/21/21 0802     Subjective Pt states she is having leakage of feces a couple times per week.  Pt works at home sitting a lot.  States her stool is pretty soft.  Started a couple years ago but increasingly got worse.  I have had back pain in the past but not currently and have arthritis in hips and that occasional flare ups.  Have BM several times per day.  Bladder leakage a little when coughing and sneezing. Gas leaks and have no control.    Limitations Walking;Standing    How long can you stand comfortably? need to lean on something to keep from feeling hip and back pain    How long can you walk comfortably? need to lean on something to keep from feeling hip and back pain (shopping cart)    Patient Stated Goals stop having leakage    Currently in Pain? No/denies                [facility] PT Assessment - 01/21/21 0001       Assessment   Medical Diagnosis R15.1 (ICD-10-CM) - Fecal smearing    Referring Provider (PT) Jerene Bears, MD    Onset Date/Surgical Date --   2  years   Prior Therapy No      Balance Screen   Has the patient fallen in the past 6 months No      Home Environment   Living Environment Private residence    Living Arrangements Spouse/significant other      Prior Function   Level of Independence Independent    Vocation Full time employment    Leisure walk or ride bike daily;      Cognition   Overall Cognitive Status Within Functional Limits for tasks assessed      Functional Tests   Functional tests Single leg stance      Single Leg Stance   Comments trendelenburg      Posture/Postural Control   Posture/Postural Control Postural limitations    Postural Limitations Rounded Shoulders;Anterior pelvic tilt;Increased lumbar lordosis      ROM / Strength   AROM / PROM / Strength AROM;PROM;Strength      AROM   Overall AROM Comments normal lumbar flexion and extension      PROM   Overall PROM Comments bil hip WNL      Strength   Overall  Strength Comments 4/5 bil hip abduction      Flexibility   Soft Tissue Assessment /Muscle Length yes    Hamstrings 90% bil      Palpation   Palpation comment tight lumbar paraspinals and hip flexor muscles      Ambulation/Gait   Gait Pattern Within Functional Limits                        Objective measurements completed on examination: See above findings.     Pelvic Floor Special Questions - 01/21/21 0001     Prior Pregnancies Yes    Number of Pregnancies 4   2 miscarriages   Number of Vaginal Deliveries 2    Currently Sexually Active --   no pain   Urinary Leakage Yes    How often occasionally not much    Pad use no    Activities that cause leaking Coughing;Sneezing;Laughing    Urinary urgency No    Urinary frequency 4-5/day and 1/night    Fecal incontinence Yes    Pelvic Floor Internal Exam pt idenity confirmed and consent given for internal soft tissue assessment    Exam Type Rectal    Palpation lower muscle tone; no trigger points or tension    Strength fair squeeze, definite lift    Strength # of reps 5   quick flicks   Strength # of seconds 4    Tone mildly low                       PT Education - 01/21/21 0856     Education Details Access Code: [email or web address]    Person(s) Educated Patient    Methods Explanation;Demonstration;Tactile cues;Handout;Verbal cues    Comprehension Verbalized understanding;Returned demonstration              PT Short Term Goals - 01/21/21 0903       PT SHORT TERM GOAL #1   Title ind with initial HEP    Time 4    Period Weeks    Status New    Target Date 02/18/21               PT Long Term Goals - 01/21/21 0856       PT LONG TERM GOAL #1   Title Pt will report  no fecal leakage that causes her to need to change underwear due to improved muscle tone    Baseline 2/week    Time 12    Period Weeks    Status New    Target Date 04/15/21      PT LONG TERM GOAL #2   Title pt will be able to  cough and sneeze without bladder leakage due to improve muscle coordination    Time 12    Period Weeks    Status New    Target Date 04/15/21      PT LONG TERM GOAL #3   Title Pt will be ind with advanced HEP    Time 12    Period Weeks    Status New    Target Date 04/15/21      PT LONG TERM GOAL #4   Title Pt will be able to take a walk with 50% less hip and back pain due to improved hip strength and alignment    Time 12    Period Weeks    Status New    Target Date 04/15/21                    Plan - 01/21/21 0842     Clinical Impression Statement Pt presents to clinic due to fecal incontinence that has gradually worsened over the last 2 years. Pt demosntrates increased lumbar lordosis and anterior pelvic tilt.  She has hip weakness in single leg standing and hip abduction strength of 4/5 MMT bil.  Pt has tight hip flexors and lumbar paraspinals.  Pt demonstrates pelvic floor weakness of 3/5 MMT and decreased endurance of 4 seconds. Based on clinical finding pt will benefit from postural and core strengthening along with pelvic floor strengthening and address all impairments mentioned for maximum function without leakage.    Examination-Activity Limitations Toileting;Continence    Examination-Participation Restrictions Community Activity;Interpersonal Relationship    Stability/Clinical Decision Making Evolving/Moderate complexity    Clinical Decision Making Low    Rehab Potential Excellent    PT Frequency 1x / week    PT Duration 12 weeks    PT Treatment/Interventions ADLs/Self Care Home Management;Biofeedback;Cryotherapy;Electrical Stimulation;Moist Heat;Neuromuscular re-education;Therapeutic exercise;Therapeutic activities;Patient/family education;Manual techniques;Passive range of motion;Taping;Dry needling    PT Next Visit Plan pelvic tilt and clam with kegel; hip abdcution with band; mini squat kegel; basic core strength    PT Home Exercise Plan Access Code: [email or web address]     Consulted and Agree with Plan of Care Patient             Patient will benefit from skilled therapeutic intervention in order to improve the following deficits and impairments:  Postural dysfunction, Decreased strength, Decreased endurance, Difficulty walking  Visit Diagnosis: Muscle weakness (generalized) - Plan: PT plan of care cert/re-cert  Abnormal posture - Plan: PT plan of care cert/re-cert     Problem List Patient Active Problem List   Diagnosis Date Noted   H/O LEEP 01/16/2021   Fecal smearing 01/16/2021   History of depression 01/16/2021   HSV infection 01/16/2021   Spondylolisthesis at L5-S1 level 10/28/2019   Von Willebrand disease 01/20/2015   Localized osteoporosis without current pathological fracture 02/04/2012   Headache, chronic daily 02/04/2012   Vaginal atrophy 07/14/2008   Subclinical thyrotoxicosis 07/30/2007    Junious Silk, PT 01/21/2021, 9:14 AM  [facility] @ [facility] [address] [place], [place], [address] Phone: [phone]   Fax:  [phone]  Name: Haley  Tagan Buchanan MRN: [id number] Date of Birth: 1955/07/12

## 2021-01-21 NOTE — Patient Instructions (Signed)
Access Code: [email or web address] URL: [email or web address] Date: 01/21/2021 Prepared by: Dwana Curd  Exercises Standing Hip Flexor Stretch - 3 x daily - 7 x weekly - 1 sets - 2 reps - 30 sec hold Seated Kegel - 3 x daily - 7 x weekly - 1 sets - 10 reps - 3 hold

## 2021-01-29 ENCOUNTER — Other Ambulatory Visit: Payer: Self-pay | Admitting: Obstetrics & Gynecology

## 2021-01-29 DIAGNOSIS — M810 Age-related osteoporosis without current pathological fracture: Secondary | ICD-10-CM

## 2021-01-29 DIAGNOSIS — Z1231 Encounter for screening mammogram for malignant neoplasm of breast: Secondary | ICD-10-CM

## 2021-02-01 ENCOUNTER — Encounter: Payer: Self-pay | Admitting: Physical Therapy

## 2021-02-01 ENCOUNTER — Ambulatory Visit: Payer: No Typology Code available for payment source | Attending: Obstetrics & Gynecology | Admitting: Physical Therapy

## 2021-02-01 ENCOUNTER — Other Ambulatory Visit: Payer: Self-pay

## 2021-02-01 DIAGNOSIS — M6281 Muscle weakness (generalized): Secondary | ICD-10-CM | POA: Diagnosis present

## 2021-02-01 DIAGNOSIS — R293 Abnormal posture: Secondary | ICD-10-CM | POA: Insufficient documentation

## 2021-02-01 NOTE — Therapy (Signed)
[facility] @ [facility] [address] [place], [place], [address] Phone: [phone]   Fax:  [phone]  Physical Therapy Treatment  Patient Details  Name: Haley Buchanan MRN: [id number] Date of Birth: 09/13/1955 Referring Provider (PT): Jerene Bears, MD   Encounter Date: 02/01/2021   PT End of Session - 02/01/21 1531     Visit Number 2    Date for PT Re-Evaluation 04/15/21    Authorization Type UHC    PT Start Time 1531    PT Stop Time 1611    PT Time Calculation (min) 40 min    Activity Tolerance Patient tolerated treatment well    Behavior During Therapy [facility] for tasks assessed/performed             Past Medical History:  Diagnosis Date   Abnormal glandular Papanicolaou smear of cervix 06/2005   followed by LEEP, + HR HPV DNA 06/2008   Allergy    Anemia 09/2004   iron deficient   Anxiety    Arthritis, hip    Broken clavicle 10/23/2015   pt states she fell in bathroom    Depression    Last psychiatrist - Dr. Melrose Nakayama - does not have records as seen prior to 2007   Foot fracture, left 11/2006   5th MTP   Lumbar herniated disc    Migraine    Osteoporosis 02/2006   DEXA 04/23/12 w/ T score L femur -2.8 and L spine -1.4   Thyroid disease 01/2007   subclinical hyperthyroidism   Von Willebrand's disease 03/2005    Past Surgical History:  Procedure Laterality Date   COLONOSCOPY N/A 10/15/2012   Procedure: COLONOSCOPY;  Surgeon: Charna Elizabeth, MD;  Location: [facility];  Service: Endoscopy;  Laterality: N/A;   COLPORRHAPHY  01/27/15   [place]osterior- [facility], Dr. Gala Lewandowsky   LEEP  2007   LEEP N/A 05/06/2016   Procedure: LOOP ELECTROSURGICAL EXCISION PROCEDURE (LEEP) with colposcopy ;  Surgeon: Jerene Bears, MD;  Location: [facility];  Service: Gynecology;  Laterality: N/A;   TMJ ARTHROSCOPY  1996   TONSILLECTOMY AND ADENOIDECTOMY      There were no vitals filed for this visit.   Subjective  Assessment - 02/01/21 1533     Subjective Pt had a weekend where the stool felt more mushy and it was hard to control. I was trying to drink more water and urinating a lot and the pads kept getting dirty.    Patient Stated Goals stop having leakage    Currently in Pain? No/denies                               [facility] Adult PT Treatment/Exercise - 02/01/21 0001       Neuro Re-ed    Neuro Re-ed Details  kegel and breathing with core and pelvic floor during all exercises      Exercises   Exercises Lumbar      Lumbar Exercises: Stretches   Other Lumbar Stretch Exercise thoracic flex and rotate with breathing for improved mobility      Lumbar Exercises: Supine   Clam 20 reps    Clam Limitations red band and kegel with TC      Lumbar Exercises: Sidelying   Clam Both;20 reps    Clam Limitations red and cues to engage core and pelvic floor      Manual Therapy   Manual Therapy  Myofascial release    Myofascial Release lower ribcage in supine bil - movement with stacking in all planes, thoracic paraspinals                     PT Education - 02/01/21 1620     Education Details Access Code: [email or web address]    Person(s) Educated Patient    Methods Explanation;Demonstration;Tactile cues;Verbal cues    Comprehension Returned demonstration;Verbalized understanding              PT Short Term Goals - 02/01/21 1544       PT SHORT TERM GOAL #1   Title ind with initial HEP    Status Achieved               PT Long Term Goals - 01/21/21 0856       PT LONG TERM GOAL #1   Title Pt will report no fecal leakage that causes her to need to change underwear due to improved muscle tone    Baseline 2/week    Time 12    Period Weeks    Status New    Target Date 04/15/21      PT LONG TERM GOAL #2   Title pt will be able to cough and sneeze without bladder leakage due to improve muscle coordination    Time 12    Period Weeks    Status New    Target Date  04/15/21      PT LONG TERM GOAL #3   Title Pt will be ind with advanced HEP    Time 12    Period Weeks    Status New    Target Date 04/15/21      PT LONG TERM GOAL #4   Title Pt will be able to take a walk with 50% less hip and back pain due to improved hip strength and alignment    Time 12    Period Weeks    Status New    Target Date 04/15/21                   Plan - 02/01/21 1544     Clinical Impression Statement Pt was able to do exercises at home, but has not noticed any difference with the leakage yet.  Pt was able to demonstrate exercises with coordination of pelvic floor engaged.  Pt has gluteal medius weakness and has difficulty with hip abduciton and needed to concentrate more with kegels when using the gluteal muscles.  Pt has stiffness in lower ribcage and increased excursion and rotation to the left.  Pt will benefit from skilled PT to continue to porgress mobility and core strength to return to maximum function without leakage.    PT Treatment/Interventions ADLs/Self Care Home Management;Biofeedback;Cryotherapy;Electrical Stimulation;Moist Heat;Neuromuscular re-education;Therapeutic exercise;Therapeutic activities;Patient/family education;Manual techniques;Passive range of motion;Taping;Dry needling    PT Next Visit Plan core and hip strength with kegel, ribcage and thoracic mobility walking with iso shoulder flexion, lift with kegel, squat and hip hinge kegels    PT Home Exercise Plan Access Code: [email or web address]    Consulted and Agree with Plan of Care Patient             Patient will benefit from skilled therapeutic intervention in order to improve the following deficits and impairments:  Postural dysfunction, Decreased strength, Decreased endurance, Difficulty walking  Visit Diagnosis: Muscle weakness (generalized)  Abnormal posture     Problem List Patient Active Problem List   Diagnosis Date Noted  H/O LEEP 01/16/2021   Fecal smearing 01/16/2021    History of depression 01/16/2021   HSV infection 01/16/2021   Spondylolisthesis at L5-S1 level 10/28/2019   Von Willebrand disease 01/20/2015   Localized osteoporosis without current pathological fracture 02/04/2012   Headache, chronic daily 02/04/2012   Vaginal atrophy 07/14/2008   Subclinical thyrotoxicosis 07/30/2007    Junious Silk, PT 02/01/2021, 4:25 PM  [facility] @ [facility] [address] [place], [place], [address] Phone: [phone]   Fax:  [phone]  Name: Rosamae Rocque MRN: [id number] Date of Birth: 04/08/1955

## 2021-02-01 NOTE — Patient Instructions (Addendum)
Access Code: [email or web address] URL: [email or web address] Date: 02/01/2021 Prepared by: Dwana Curd  Exercises Standing Hip Flexor Stretch - 3 x daily - 7 x weekly - 1 sets - 2 reps - 30 sec hold Seated Kegel - 3 x daily - 7 x weekly - 1 sets - 10 reps - 3 hold Seated Thoracic Flexion and Rotation with Arms Crossed - 1 x daily - 7 x weekly - 10 reps - 1 sets - 5 sec hold Hooklying Clamshells with Resistance - 1 x daily - 7 x weekly - 3 sets - 10 reps Clam with Resistance - 1 x daily - 7 x weekly - 3 sets - 10 reps

## 2021-02-03 ENCOUNTER — Other Ambulatory Visit: Payer: Self-pay

## 2021-02-03 ENCOUNTER — Ambulatory Visit (INDEPENDENT_AMBULATORY_CARE_PROVIDER_SITE_OTHER): Payer: No Typology Code available for payment source | Admitting: Endocrinology

## 2021-02-03 DIAGNOSIS — R7989 Other specified abnormal findings of blood chemistry: Secondary | ICD-10-CM | POA: Diagnosis not present

## 2021-02-03 LAB — TSH: TSH: 0.72 u[IU]/mL (ref 0.35–5.50)

## 2021-02-03 LAB — T3, FREE: T3, Free: 3.1 pg/mL (ref 2.3–4.2)

## 2021-02-03 LAB — T4, FREE: Free T4: 0.9 ng/dL (ref 0.60–1.60)

## 2021-02-03 NOTE — Progress Notes (Signed)
Subjective:    Patient ID: Haley Buchanan, female    DOB: 10/04/1955, 65 y.o.   MRN: [id number]  HPI Pt is referred by Dr Claudette Laws, for abnormal TFT.  Pt reports he was dx'ed with abnormal TFT in 2022.  She took thyroid hormone from naturopathic dr x a few mos, in approx 1992, but none since.  she does not consume kelp or any other non-prescribed thyroid medication.  she has never been on amiodarone.  She reports cold intolerance, fatigue, and difficulty with concentration.  She has not recently taken premarin cream.    Past Medical History:  Diagnosis Date   Abnormal glandular Papanicolaou smear of cervix 06/2005   followed by LEEP, + HR HPV DNA 06/2008   Allergy    Anemia 09/2004   iron deficient   Anxiety    Arthritis, hip    Broken clavicle 10/23/2015   pt states she fell in bathroom    Depression    Last psychiatrist - Dr. Melrose Nakayama - does not have records as seen prior to 2007   Foot fracture, left 11/2006   5th MTP   Lumbar herniated disc    Migraine    Osteoporosis 02/2006   DEXA 04/23/12 w/ T score L femur -2.8 and L spine -1.4   Thyroid disease 01/2007   subclinical hyperthyroidism   Von Willebrand's disease 03/2005    Past Surgical History:  Procedure Laterality Date   COLONOSCOPY N/A 10/15/2012   Procedure: COLONOSCOPY;  Surgeon: Charna Elizabeth, MD;  Location: [facility];  Service: Endoscopy;  Laterality: N/A;   COLPORRHAPHY  01/27/15   [place]oster[place]- [facility], Dr. Gala Lewandowsky   LEEP  2007   LEEP N/A 05/06/2016   Procedure: LOOP ELECTROSURGICAL EXCISION PROCEDURE (LEEP) with colposcopy ;  Surgeon: Jerene Bears, MD;  Location: [facility];  Service: Gynecology;  Laterality: N/A;   TMJ ARTHROSCOPY  1996   TONSILLECTOMY AND ADENOIDECTOMY      Social History   Socioeconomic History   Marital status: Married    Spouse name: Not on file   Number of children: Not on file   Years of education: Not on file   Highest education level: Not on file   Occupational History   Not on file  Tobacco Use   Smoking status: Former    Packs/day: 1.00    Years: 2.00    Pack years: 2.00    Types: Cigarettes    Quit date: 03/29/1999    Years since quitting: 21.8   Smokeless tobacco: Never  Vaping Use   Vaping Use: Never used  Substance and Sexual Activity   Alcohol use: Yes    Alcohol/week: 7.0 standard drinks    Types: 7 Glasses of wine per week   Drug use: No   Sexual activity: Yes    Birth control/protection: Post-menopausal  Other Topics Concern   Not on file  Social History Narrative   Not on file   Social Determinants of Health   Financial Resource Strain: Not on file  Food Insecurity: Not on file  Transportation Needs: Not on file  Physical Activity: Not on file  Stress: Not on file  Social Connections: Not on file  Intimate Partner Violence: Not on file    Current Outpatient Medications on File Prior to Visit  Medication Sig Dispense Refill   alendronate (FOSAMAX) 70 MG tablet TAKE 1 TABLET BY MOUTH  EVERY 7 DAYS WITH A FULL  GLASS OF WATER ON AN EMPTY  STOMACH  12 tablet 4   ALPRAZolam (XANAX) 0.25 MG tablet Take 1 tablet (0.25 mg total) by mouth at bedtime as needed for anxiety. 30 tablet 0   aspirin-acetaminophen-caffeine (EXCEDRIN MIGRAINE) 250-250-65 MG tablet Take 1 tablet by mouth every 8 (eight) hours as needed for headache or migraine.      buPROPion (WELLBUTRIN XL) 150 MG 24 hr tablet Take 1 tablet by mouth daily.     conjugated estrogens (PREMARIN) 0.625 MG/GM vaginal cream daily as needed.     COVID-19 mRNA bivalent vaccine, Pfizer, (PFIZER COVID-19 VAC BIVALENT) injection Inject into the muscle. 0.3 mL 0   FLUoxetine (PROZAC) 40 MG capsule Take 1 capsule (40 mg total) by mouth daily. 90 capsule 4   gabapentin (NEURONTIN) 300 MG capsule Take 2 capsules (600 mg) 3 times a day. 180 capsule 2   glucosamine-chondroitin 500-400 MG tablet Take 1 tablet by mouth 2 times daily at 12 noon and 4 pm.       hydrocortisone-pramoxine (ANALPRAM-HC) 2.5-1 % rectal cream Place rectally 3 (three) times daily. Apply as needed. 30 g 0   ibuprofen (ADVIL) 800 MG tablet Take 800 mg by mouth 3 (three) times daily.     methylphenidate (RITALIN) 20 MG tablet TK 1 T PO QAM.     Multiple Vitamin tablet Take 1 tablet by mouth daily.      pneumococcal 20-valent conjugate vaccine (PREVNAR 20) 0.5 ML injection Inject into the muscle. 0.5 mL 0   Pramoxine-HC (HYDROCORTISONE ACE-PRAMOXINE) 2.5-1 % CREA Place rectally.     valACYclovir (VALTREX) 500 MG tablet Take 1 tablet (500 mg total) by mouth 2 (two) times daily. Take BID x 3 days with symptoms. 30 tablet 1   No current facility-administered medications on file prior to visit.    Allergies  Allergen Reactions   Sulfamethoxazole-Trimethoprim Other (See Comments)    sick   Macrobid [Nitrofurantoin] Nausea And Vomiting    Family History  Problem Relation Age of Onset   Osteoporosis Mother    Heart Problems Mother    Thyroid disease Mother    Cancer Father    Ulcers Father    Alcohol abuse Father    Osteoporosis Maternal Grandmother    Alzheimer's disease Maternal Grandfather    Depression Daughter    Psychiatric Illness Brother     BP (!) 142/74 (BP Location: Right Arm, Patient Position: Sitting, Cuff Size: Normal)   Pulse 78   Ht 5\' 4"  (1.626 m)   Wt 159 lb (72.1 kg)   LMP 02/17/2006   SpO2 94%   BMI 27.29 kg/m   Review of Systems No change in chronic depression.  denies weight gain and dry skin.      Objective:   Physical Exam VS: see vs page GEN: no distress HEAD: head: no deformity eyes: no periorbital swelling, no proptosis external nose and ears are normal NECK: supple, thyroid is not enlarged CHEST WALL: no deformity LUNGS: clear to auscultation CV: reg rate and rhythm, no murmur MUSCULOSKELETAL: muscle bulk and strength are grossly normal.  no obvious joint swelling.  gait is normal and steady EXTEMITIES: no deformity.  no  ulcer on the feet.  feet are of normal color and temp.  no edema PULSES: dorsalis pedis intact bilat.  no carotid bruit NEURO: readily moves all 4's.  sensation is intact to touch on the feet SKIN:  Normal texture and temperature.  No rash or suspicious lesion is visible.   NODES:  None palpable at the neck PSYCH: alert,  well-oriented.  Does not appear anxious nor depressed.    [place] (2018): Mild thyromegaly with small left nodules. None meets criteria for biopsy or dedicated imaging follow-up.    TSH=0.91 FTI=16.4  Lab Results  Component Value Date   TSH 0.65 09/14/2015   T4TOTAL 8.3 09/14/2015   I have reviewed outside records, and summarized: Pt was noted to have elevated FTI, and referred here.  She was seen for well woman exam, and general health was good.      Assessment & Plan:  Abnormal TFT, new to me, usually due to abnormal thyroid hormone binding  Patient Instructions  Blood tests are requested for you today.  We'll let you know about the results.   Update: no medication is needed

## 2021-02-03 NOTE — Patient Instructions (Signed)
Blood tests are requested for you today.  We'll let you know about the results.  

## 2021-02-09 ENCOUNTER — Encounter: Payer: No Typology Code available for payment source | Admitting: Physical Therapy

## 2021-02-24 ENCOUNTER — Encounter: Payer: Self-pay | Admitting: Physical Therapy

## 2021-02-24 ENCOUNTER — Other Ambulatory Visit: Payer: Self-pay

## 2021-02-24 ENCOUNTER — Ambulatory Visit: Payer: No Typology Code available for payment source | Admitting: Physical Therapy

## 2021-02-24 DIAGNOSIS — R293 Abnormal posture: Secondary | ICD-10-CM

## 2021-02-24 DIAGNOSIS — M6281 Muscle weakness (generalized): Secondary | ICD-10-CM | POA: Diagnosis not present

## 2021-02-24 NOTE — Therapy (Signed)
[facility] @ [facility] [address] [place], [place], [address] Phone: [phone]   Fax:  [phone]  Physical Therapy Treatment  Patient Details  Name: Haley Buchanan MRN: [id number] Date of Birth: 1955/04/25 Referring Provider (PT): Jerene Bears, MD   Encounter Date: 02/24/2021   PT End of Session - 02/24/21 1631     Visit Number 3    Date for PT Re-Evaluation 04/15/21    Authorization Type UHC    PT Start Time 1620    PT Stop Time 1658    PT Time Calculation (min) 38 min    Activity Tolerance Patient tolerated treatment well    Behavior During Therapy [facility] for tasks assessed/performed             Past Medical History:  Diagnosis Date   Abnormal glandular Papanicolaou smear of cervix 06/2005   followed by LEEP, + HR HPV DNA 06/2008   Allergy    Anemia 09/2004   iron deficient   Anxiety    Arthritis, hip    Broken clavicle 10/23/2015   pt states she fell in bathroom    Depression    Last psychiatrist - Dr. Melrose Nakayama - does not have records as seen prior to 2007   Foot fracture, left 11/2006   5th MTP   Lumbar herniated disc    Migraine    Osteoporosis 02/2006   DEXA 04/23/12 w/ T score L femur -2.8 and L spine -1.4   Thyroid disease 01/2007   subclinical hyperthyroidism   Von Willebrand's disease 03/2005    Past Surgical History:  Procedure Laterality Date   COLONOSCOPY N/A 10/15/2012   Procedure: COLONOSCOPY;  Surgeon: Charna Elizabeth, MD;  Location: [facility];  Service: Endoscopy;  Laterality: N/A;   COLPORRHAPHY  01/27/15   Posterior- [facility], Dr. Gala Lewandowsky   LEEP  2007   LEEP N/A 05/06/2016   Procedure: LOOP ELECTROSURGICAL EXCISION PROCEDURE (LEEP) with colposcopy ;  Surgeon: Jerene Bears, MD;  Location: [facility];  Service: Gynecology;  Laterality: N/A;   TMJ ARTHROSCOPY  1996   TONSILLECTOMY AND ADENOIDECTOMY      There were no vitals filed for this visit.   Subjective  Assessment - 02/24/21 1623     Subjective I had fecal smearing one day this week which is a little better.  Back was achy yesterdya.    Patient Stated Goals stop having leakage    Currently in Pain? No/denies                               [facility] Adult PT Treatment/Exercise - 02/24/21 0001       Neuro Re-ed    Neuro Re-ed Details  educated on squat technique and correct timing for kegel      Lumbar Exercises: Stretches   Active Hamstring Stretch Right;Left;2 reps;30 seconds    Hip Flexor Stretch Right;Left;3 reps;20 seconds      Lumbar Exercises: Aerobic   Nustep L2 x 5 min Pt present for status      Lumbar Exercises: Standing   Functional Squats 15 reps    Functional Squats Limitations mini squat with kegel    Shoulder Extension Strengthening;Both;20 reps;Theraband    Theraband Level (Shoulder Extension) Level 2 (Red)    Other Standing Lumbar Exercises isometric flexion yellow - 20x  PT Short Term Goals - 02/01/21 1544       PT SHORT TERM GOAL #1   Title ind with initial HEP    Status Achieved               PT Long Term Goals - 02/24/21 1624       PT LONG TERM GOAL #1   Title Pt will report no fecal leakage that causes her to need to change underwear due to improved muscle tone    Baseline 1x this week down from 2/week    Status On-going      PT LONG TERM GOAL #2   Title pt will be able to cough and sneeze without bladder leakage due to improve muscle coordination    Status On-going      PT LONG TERM GOAL #3   Title Pt will be ind with advanced HEP    Status On-going      PT LONG TERM GOAL #4   Title Pt will be able to take a walk with 50% less hip and back pain due to improved hip strength and alignment    Baseline yesterday very achy    Status On-going                   Plan - 02/24/21 1632     Clinical Impression Statement Pt was doing a little better even though she reports she has  not been able to do the exercises as much.  Pt was able to progress the exercises a little bit today to standing.  Pt will benefit from skilled PT to progress core strength and needs cues to perform exercises correctly with good coordination of core and pelvic floor.    PT Treatment/Interventions ADLs/Self Care Home Management;Biofeedback;Cryotherapy;Electrical Stimulation;Moist Heat;Neuromuscular re-education;Therapeutic exercise;Therapeutic activities;Patient/family education;Manual techniques;Passive range of motion;Taping;Dry needling    PT Home Exercise Plan Access Code: [email or web address]    Consulted and Agree with Plan of Care Patient             Patient will benefit from skilled therapeutic intervention in order to improve the following deficits and impairments:  Postural dysfunction, Decreased strength, Decreased endurance, Difficulty walking  Visit Diagnosis: Muscle weakness (generalized)  Abnormal posture     Problem List Patient Active Problem List   Diagnosis Date Noted   Abnormal thyroid blood test 02/03/2021   H/O LEEP 01/16/2021   Fecal smearing 01/16/2021   History of depression 01/16/2021   HSV infection 01/16/2021   Spondylolisthesis at L5-S1 level 10/28/2019   Von Willebrand disease 01/20/2015   Localized osteoporosis without current pathological fracture 02/04/2012   Headache, chronic daily 02/04/2012   Vaginal atrophy 07/14/2008    Junious Silk, PT 02/24/2021, 5:00 PM  [facility] @ [facility] [address] [place], [place], [address] Phone: [phone]   Fax:  [phone]  Name: Haley Buchanan MRN: [id number] Date of Birth: 04-10-1955

## 2021-03-02 ENCOUNTER — Other Ambulatory Visit: Payer: Self-pay

## 2021-03-02 ENCOUNTER — Ambulatory Visit: Payer: No Typology Code available for payment source | Attending: Obstetrics & Gynecology | Admitting: Physical Therapy

## 2021-03-02 ENCOUNTER — Encounter: Payer: Self-pay | Admitting: Physical Therapy

## 2021-03-02 DIAGNOSIS — M6281 Muscle weakness (generalized): Secondary | ICD-10-CM | POA: Insufficient documentation

## 2021-03-02 DIAGNOSIS — R293 Abnormal posture: Secondary | ICD-10-CM | POA: Insufficient documentation

## 2021-03-02 NOTE — Therapy (Addendum)
[facility] @ [place], [place], [address] Phone: [phone]   Fax:  [phone]  Physical Therapy Treatment  Patient Details  Name: Haley Buchanan MRN: [id number] Date of Birth: Nov 30, 1955 Referring Provider (PT): Megan Salon, MD   Encounter Date: 03/02/2021   PT End of Session - 03/02/21 1613     Visit Number 4    Date for PT Re-Evaluation 04/15/21    Authorization Type UHC    PT Start Time 4680    PT Stop Time 3212    PT Time Calculation (min) 40 min    Activity Tolerance Patient tolerated treatment well    Behavior During Therapy [facility] for tasks assessed/performed             Past Medical History:  Diagnosis Date   Abnormal glandular Papanicolaou smear of cervix 06/2005   followed by LEEP, + HR HPV DNA 06/2008   Allergy    Anemia 09/2004   iron deficient   Anxiety    Arthritis, hip    Broken clavicle 10/23/2015   pt states she fell in bathroom    Depression    Last psychiatrist - Dr. Yves Dill - does not have records as seen prior to 2007   Foot fracture, left 11/2006   5th MTP   Lumbar herniated disc    Migraine    Osteoporosis 02/2006   DEXA 04/23/12 w/ T score L femur -2.8 and L spine -1.4   Thyroid disease 01/2007   subclinical hyperthyroidism   Von Willebrand's disease 03/2005    Past Surgical History:  Procedure Laterality Date   COLONOSCOPY N/A 10/15/2012   Procedure: COLONOSCOPY;  Surgeon: Juanita Craver, MD;  Location: [facility];  Service: Endoscopy;  Laterality: N/A;   COLPORRHAPHY  01/27/15   [place]osterior- [facility], Dr. Carlota Raspberry   LEEP  2007   LEEP N/A 05/06/2016   Procedure: LOOP ELECTROSURGICAL EXCISION PROCEDURE (LEEP) with colposcopy ;  Surgeon: Megan Salon, MD;  Location: [facility];  Service: Gynecology;  Laterality: N/A;   TMJ ARTHROSCOPY  1996   TONSILLECTOMY AND ADENOIDECTOMY      There were no vitals filed for this visit.   Subjective  Assessment - 03/02/21 1612     Subjective I think the incontinence is getting better.  My back is okay but hips hurting a little the last couple of days.  I have been going through cognitive issues that has really been upsetting.  It is hard to think and I have been forgetting things at work.    Patient Stated Goals stop having leakage    Currently in Pain? No/denies                               [facility] Adult PT Treatment/Exercise - 03/02/21 0001       Lumbar Exercises: Stretches   Other Lumbar Stretch Exercise hip rotation IR/ER - 5 x 10 sec      Lumbar Exercises: Standing   Other Standing Lumbar Exercises isolating pelvic floor; standing and weight shift forward and lifting with kegel      Lumbar Exercises: Seated   Other Seated Lumbar Exercises ball squeeze with kick; hip abduction blue loop - 20x      Lumbar Exercises: Supine   Heel Slides 15 reps      Lumbar Exercises: Sidelying   Other Sidelying Lumbar Exercises clam and reverse clams  with kegel                       PT Short Term Goals - 02/01/21 1544       PT SHORT TERM GOAL #1   Title ind with initial HEP    Status Achieved               PT Long Term Goals - 03/02/21 2010       PT LONG TERM GOAL #1   Title Pt will report no fecal leakage that causes her to need to change underwear due to improved muscle tone    Baseline better    Status On-going      PT LONG TERM GOAL #2   Title pt will be able to cough and sneeze without bladder leakage due to improve muscle coordination    Status On-going      PT LONG TERM GOAL #3   Title Pt will be ind with advanced HEP    Status On-going      PT LONG TERM GOAL #4   Title Pt will be able to take a walk with 50% less hip and back pain due to improved hip strength and alignment    Baseline not hurting today    Status On-going                   Plan - 03/02/21 1639     Clinical Impression Statement Pt states the  leakage is better but still having gas leakage.  Pt having some Rt hip pain today with flexion so exercises were modified to be pain free.  Pt needs cues to exhale with exertion and not hold her breath. Pt was able to progress HEP and modified focus to single leg and pelvic floor isolation exercises.    PT Treatment/Interventions ADLs/Self Care Home Management;Biofeedback;Cryotherapy;Electrical Stimulation;Moist Heat;Neuromuscular re-education;Therapeutic exercise;Therapeutic activities;Patient/family education;Manual techniques;Passive range of motion;Taping;Dry needling    PT Next Visit Plan f/u with leaning on table to isolate pelvic floor, single leg with stepping and tightening, lifting band    PT Home Exercise Plan Access Code: [email or web address]    Consulted and Agree with Plan of Care Patient             Patient will benefit from skilled therapeutic intervention in order to improve the following deficits and impairments:  Postural dysfunction, Decreased strength, Decreased endurance, Difficulty walking  Visit Diagnosis: Muscle weakness (generalized)  Abnormal posture     Problem List Patient Active Problem List   Diagnosis Date Noted   Abnormal thyroid blood test 02/03/2021   H/O LEEP 01/16/2021   Fecal smearing 01/16/2021   History of depression 01/16/2021   HSV infection 01/16/2021   Spondylolisthesis at L5-S1 level 10/28/2019   Von Willebrand disease 01/20/2015   Localized osteoporosis without current pathological fracture 02/04/2012   Headache, chronic daily 02/04/2012   Vaginal atrophy 07/14/2008    Haley Buchanan, PT 03/02/2021, 8:32 PM  [facility] @ [place], [place], [address] Phone: [phone]   Fax:  [phone]  Name: Haley Buchanan MRN: [id number] Date of Birth: 1956-01-06   PHYSICAL THERAPY DISCHARGE SUMMARY  Visits from Start of Care: 4  Current functional level related to  goals / functional outcomes: See above goals   Remaining deficits: See above   Education / Equipment: HEP   Patient agrees to discharge. Patient goals were partially met. Patient is being discharged due  to not returning since the last visit.  Gustavus Bryant, PT 04/26/21 12:21 PM

## 2021-03-09 ENCOUNTER — Encounter: Payer: No Typology Code available for payment source | Admitting: Physical Therapy

## 2021-03-16 ENCOUNTER — Encounter: Payer: No Typology Code available for payment source | Admitting: Physical Therapy

## 2021-03-23 ENCOUNTER — Ambulatory Visit: Payer: No Typology Code available for payment source | Admitting: Physical Therapy

## 2021-04-29 ENCOUNTER — Other Ambulatory Visit: Payer: Self-pay

## 2021-04-29 ENCOUNTER — Ambulatory Visit: Payer: No Typology Code available for payment source | Attending: Obstetrics & Gynecology | Admitting: Physical Therapy

## 2021-04-29 DIAGNOSIS — R293 Abnormal posture: Secondary | ICD-10-CM | POA: Diagnosis present

## 2021-04-29 DIAGNOSIS — R151 Fecal smearing: Secondary | ICD-10-CM | POA: Insufficient documentation

## 2021-04-29 DIAGNOSIS — M6281 Muscle weakness (generalized): Secondary | ICD-10-CM | POA: Insufficient documentation

## 2021-04-30 ENCOUNTER — Encounter: Payer: Self-pay | Admitting: Physical Therapy

## 2021-04-30 ENCOUNTER — Other Ambulatory Visit (HOSPITAL_BASED_OUTPATIENT_CLINIC_OR_DEPARTMENT_OTHER): Payer: Self-pay | Admitting: Obstetrics & Gynecology

## 2021-04-30 DIAGNOSIS — R151 Fecal smearing: Secondary | ICD-10-CM

## 2021-04-30 NOTE — Therapy (Signed)
[facility] @ [facility] [address] [place], [place], [address] Phone: [phone]   Fax:  [phone]  Physical Therapy Evaluation  Patient Details  Name: Haley Buchanan MRN: [id number] Date of Birth: 1955/09/29 Referring Provider (PT): Jerene Bears, MD   Encounter Date: 04/29/2021   PT End of Session - 04/30/21 1845     Visit Number 1    Date for PT Re-Evaluation 07/22/21    Authorization Type UHC    PT Start Time 0808    PT Stop Time 0843    PT Time Calculation (min) 35 min    Activity Tolerance Patient tolerated treatment well    Behavior During Therapy [facility] for tasks assessed/performed             Past Medical History:  Diagnosis Date   Abnormal glandular Papanicolaou smear of cervix 06/2005   followed by LEEP, + HR HPV DNA 06/2008   Allergy    Anemia 09/2004   iron deficient   Anxiety    Arthritis, hip    Broken clavicle 10/23/2015   pt states she fell in bathroom    Depression    Last psychiatrist - Dr. Melrose Nakayama - does not have records as seen prior to 2007   Foot fracture, left 11/2006   5th MTP   Lumbar herniated disc    Migraine    Osteoporosis 02/2006   DEXA 04/23/12 w/ T score L femur -2.8 and L spine -1.4   Thyroid disease 01/2007   subclinical hyperthyroidism   Von Willebrand's disease 03/2005    Past Surgical History:  Procedure Laterality Date   COLONOSCOPY N/A 10/15/2012   Procedure: COLONOSCOPY;  Surgeon: Charna Elizabeth, MD;  Location: [facility];  Service: Endoscopy;  Laterality: N/A;   COLPORRHAPHY  01/27/15   [place]osterior- [facility], Dr. Gala Lewandowsky   LEEP  2007   LEEP N/A 05/06/2016   Procedure: LOOP ELECTROSURGICAL EXCISION PROCEDURE (LEEP) with colposcopy ;  Surgeon: Jerene Bears, MD;  Location: [facility];  Service: Gynecology;  Laterality: N/A;   TMJ ARTHROSCOPY  1996   TONSILLECTOMY AND ADENOIDECTOMY      There were no vitals filed for this visit.    Subjective  Assessment - 04/30/21 1846     Subjective I have only had a little leakage maybe once a week so it is better.  My depression has resolved and that was keeping me from doing the exercises like I needed to.  Pt states back and hip is not hurting anymore.  I want to make sure I am doing everything I need to do and get rid of leakage.    Patient Stated Goals stop having leakage    Currently in Pain? No/denies                [facility] PT Assessment - 04/30/21 0001       Assessment   Medical Diagnosis R15.1 (ICD-10-CM) - Fecal smearing    Referring Provider (PT) Jerene Bears, MD    Onset Date/Surgical Date --   2 years   Prior Therapy No      Balance Screen   Has the patient fallen in the past 6 months No      Home Environment   Living Environment Private residence    Living Arrangements Spouse/significant other      Prior Function   Level of Independence Independent    Vocation Full time employment    Leisure walk or  ride bike daily;      Cognition   Overall Cognitive Status Within Functional Limits for tasks assessed      Functional Tests   Functional tests Single leg stance      Single Leg Stance   Comments trendelenburg      Posture/Postural Control   Posture/Postural Control Postural limitations    Postural Limitations Rounded Shoulders;Anterior pelvic tilt;Increased lumbar lordosis;Flexed trunk      AROM   Overall AROM Comments normal lumbar flexion and extension      PROM   Overall PROM Comments right hip flexion and IR 75%; Lt hip IR 75%      Strength   Overall Strength Comments 4/5 bil hip abduction/adduction      Flexibility   Soft Tissue Assessment /Muscle Length yes    Hamstrings 90% bil      Palpation   Palpation comment tight lumbar paraspinals and hip flexor muscles      Ambulation/Gait   Gait Pattern Within Functional Limits                        Objective measurements completed on examination: See above findings.      Pelvic Floor Special Questions - 04/30/21 0001     Prior Pregnancies Yes    Number of Vaginal Deliveries 2    Urinary Leakage Yes    How often occasionally    Pad use wearing diapers some days, I wear something after having an accident and then a day or two after that, but it hasn't happened for a month    Activities that cause leaking Coughing;Sneezing;Laughing    Fecal incontinence Yes    External Palpation palpation outside of underwear; 10 quick flicks; holding 11 sec; good lift after the first rep, did not notice any breath holding after cues    Exam Type Rectal;Deferred                         PT Short Term Goals - 02/01/21 1544       PT SHORT TERM GOAL #1   Title ind with initial HEP    Status Achieved               PT Long Term Goals - 04/30/21 1901       PT LONG TERM GOAL #1   Title Pt will report no fecal leakage due to improved muscle tone and endurance    Baseline at least 1/week    Time 12    Period Weeks    Status New    Target Date 07/22/21      PT LONG TERM GOAL #2   Title pt will be able to cough and sneeze without bladder leakage due to improve muscle coordination    Time 12    Period Weeks    Status New    Target Date 07/22/21      PT LONG TERM GOAL #3   Title Pt will be ind with advanced HEP    Time 12    Period Weeks    Status New    Target Date 07/22/21      PT LONG TERM GOAL #4   Title Pt will have 5/5 bil hip strength to demonstrate improved posture with standing and walking    Time 12    Period Weeks    Status New    Target Date 07/22/21  Plan - 04/30/21 1847     Clinical Impression Statement Pt presents to clinic for same issue she was seen for previously.  Due to other medical issues and depression, she was not able to focus on PT.  Pt has hip weakness bil, posture abnormalities.  Pt has pelvic floor limitations such as endurance that she will benefit from skilled PT to address in  order to return to maximum function without leakage    Personal Factors and Comorbidities Comorbidity 1    Comorbidities hx depression    Examination-Activity Limitations Toileting;Continence    Examination-Participation Restrictions Community Activity;Interpersonal Relationship    Stability/Clinical Decision Making Stable/Uncomplicated    Clinical Decision Making Low    Rehab Potential Excellent    PT Frequency 1x / week    PT Duration 12 weeks    PT Treatment/Interventions ADLs/Self Care Home Management;Biofeedback;Cryotherapy;Electrical Stimulation;Moist Heat;Neuromuscular re-education;Therapeutic exercise;Therapeutic activities;Patient/family education;Manual techniques;Passive range of motion;Taping;Dry needling    PT Next Visit Plan continue to progress core and pelvic floor strength as able, add nustep or eliptical    PT Home Exercise Plan Access Code: [email or web address]    Consulted and Agree with Plan of Care Patient             Patient will benefit from skilled therapeutic intervention in order to improve the following deficits and impairments:  Postural dysfunction, Decreased strength, Decreased endurance, Difficulty walking  Visit Diagnosis: Muscle weakness (generalized)  Abnormal posture     Problem List Patient Active Problem List   Diagnosis Date Noted   Abnormal thyroid blood test 02/03/2021   H/O LEEP 01/16/2021   Fecal smearing 01/16/2021   History of depression 01/16/2021   HSV infection 01/16/2021   Spondylolisthesis at L5-S1 level 10/28/2019   Von Willebrand disease 01/20/2015   Localized osteoporosis without current pathological fracture 02/04/2012   Headache, chronic daily 02/04/2012   Vaginal atrophy 07/14/2008    Junious Silk, PT 04/30/2021, 7:24 PM  [facility] Re[facility] @ [facility] [address] [place], [place], [address] Phone: [phone]   Fax:  [phone]  Name: Haley Buchanan MRN:  [id number] Date of Birth: 02-10-1956

## 2021-05-06 ENCOUNTER — Ambulatory Visit: Payer: No Typology Code available for payment source | Admitting: Physical Therapy

## 2021-05-06 ENCOUNTER — Other Ambulatory Visit: Payer: Self-pay

## 2021-05-06 ENCOUNTER — Encounter: Payer: Self-pay | Admitting: Physical Therapy

## 2021-05-06 DIAGNOSIS — M6281 Muscle weakness (generalized): Secondary | ICD-10-CM | POA: Diagnosis not present

## 2021-05-06 DIAGNOSIS — R293 Abnormal posture: Secondary | ICD-10-CM

## 2021-05-06 NOTE — Therapy (Signed)
[facility] [address] [place], [place], [address] Phone: [phone]   Fax:  [phone]  Physical Therapy Treatment  Patient Details  Name: Haley Buchanan MRN: [id number] Date of Birth: 02/27/1956 Referring Provider (PT): Jerene Bears, MD   Encounter Date: 05/06/2021   PT End of Session - 05/06/21 0803     Visit Number 2    Date for PT Re-Evaluation 07/22/21    Authorization Type UHC    PT Start Time 0800    PT Stop Time 0840    PT Time Calculation (min) 40 min    Activity Tolerance Patient tolerated treatment well    Behavior During Therapy [facility] for tasks assessed/performed             Past Medical History:  Diagnosis Date   Abnormal glandular Papanicolaou smear of cervix 06/2005   followed by LEEP, + HR HPV DNA 06/2008   Allergy    Anemia 09/2004   iron deficient   Anxiety    Arthritis, hip    Broken clavicle 10/23/2015   pt states she fell in bathroom    Depression    Last psychiatrist - Dr. Melrose Nakayama - does not have records as seen prior to 2007   Foot fracture, left 11/2006   5th MTP   Lumbar herniated disc    Migraine    Osteoporosis 02/2006   DEXA 04/23/12 w/ T score L femur -2.8 and L spine -1.4   Thyroid disease 01/2007   subclinical hyperthyroidism   Von Willebrand's disease 03/2005    Past Surgical History:  Procedure Laterality Date   COLONOSCOPY N/A 10/15/2012   Procedure: COLONOSCOPY;  Surgeon: Charna Elizabeth, MD;  Location: [facility];  Service: Endoscopy;  Laterality: N/A;   COLPORRHAPHY  01/27/15   [place]osterior- [facility], Dr. Gala Lewandowsky   LEEP  2007   LEEP N/A 05/06/2016   Procedure: LOOP ELECTROSURGICAL EXCISION PROCEDURE (LEEP) with colposcopy ;  Surgeon: Jerene Bears, MD;  Location: [facility];  Service: Gynecology;  Laterality: N/A;   TMJ ARTHROSCOPY  1996   TONSILLECTOMY AND ADENOIDECTOMY      There were no vitals filed for this visit.   Subjective  Assessment - 05/06/21 0814     Subjective I have been better with the exercises this week.  No new reports otherwise    Patient Stated Goals stop having leakage    Currently in Pain? No/denies                               [facility] Adult PT Treatment/Exercise - 05/06/21 0001       Lumbar Exercises: Aerobic   Nustep L2 x 7 min warm up core and status update      Lumbar Exercises: Standing   Other Standing Lumbar Exercises lift 3lb with exhale on exertion and engaged core      Lumbar Exercises: Seated   Sit to Stand 10 reps      Lumbar Exercises: Supine   AB Set Limitations press thighs, chest press;    Bent Knee Raise 10 reps    Bridge with clamshell 10 reps    Other Supine Lumbar Exercises bent knee fall out      Lumbar Exercises: Sidelying   Hip Abduction Both;10 reps    Hip Abduction Limitations needs min A to stabilize pelvis  PT Short Term Goals - 02/01/21 1544       PT SHORT TERM GOAL #1   Title ind with initial HEP    Status Achieved               PT Long Term Goals - 04/30/21 1901       PT LONG TERM GOAL #1   Title Pt will report no fecal leakage due to improved muscle tone and endurance    Baseline at least 1/week    Time 12    Period Weeks    Status New    Target Date 07/22/21      PT LONG TERM GOAL #2   Title pt will be able to cough and sneeze without bladder leakage due to improve muscle coordination    Time 12    Period Weeks    Status New    Target Date 07/22/21      PT LONG TERM GOAL #3   Title Pt will be ind with advanced HEP    Time 12    Period Weeks    Status New    Target Date 07/22/21      PT LONG TERM GOAL #4   Title Pt will have 5/5 bil hip strength to demonstrate improved posture with standing and walking    Time 12    Period Weeks    Status New    Target Date 07/22/21                   Plan - 05/06/21 0816     Clinical Impression Statement Today's  session focused on breathing using the exhale with exertion technique and simultaneously activating the core and pelvic floor.  Pt had some difficulty feeling the muscles activating initially and required VC/TC throughout session to make sure doing this correctly.  Pt will benefit from skilled PT to continue to work on muscle strength and coordination.    PT Treatment/Interventions ADLs/Self Care Home Management;Biofeedback;Cryotherapy;Electrical Stimulation;Moist Heat;Neuromuscular re-education;Therapeutic exercise;Therapeutic activities;Patient/family education;Manual techniques;Passive range of motion;Taping;Dry needling    PT Next Visit Plan continue to progress core and pelvic floor strength as able, warm up nustep or eliptical    PT Home Exercise Plan Access Code: [email or web address]    Consulted and Agree with Plan of Care Patient             Patient will benefit from skilled therapeutic intervention in order to improve the following deficits and impairments:  Postural dysfunction, Decreased strength, Decreased endurance, Difficulty walking  Visit Diagnosis: Muscle weakness (generalized)  Abnormal posture     Problem List Patient Active Problem List   Diagnosis Date Noted   Abnormal thyroid blood test 02/03/2021   H/O LEEP 01/16/2021   Fecal smearing 01/16/2021   History of depression 01/16/2021   HSV infection 01/16/2021   Spondylolisthesis at L5-S1 level 10/28/2019   Von Willebrand disease 01/20/2015   Localized osteoporosis without current pathological fracture 02/04/2012   Headache, chronic daily 02/04/2012   Vaginal atrophy 07/14/2008    Junious Silk, PT 05/06/2021, 8:41 AM  [facility] [address] [place], [place], [address] Phone: [phone]   Fax:  [phone]  Name: Haley Buchanan MRN: [id number] Date of Birth: 22-May-1955

## 2021-05-13 ENCOUNTER — Encounter: Payer: Self-pay | Admitting: Physical Therapy

## 2021-05-13 ENCOUNTER — Ambulatory Visit: Payer: No Typology Code available for payment source | Admitting: Physical Therapy

## 2021-05-13 ENCOUNTER — Other Ambulatory Visit: Payer: Self-pay

## 2021-05-13 DIAGNOSIS — M6281 Muscle weakness (generalized): Secondary | ICD-10-CM

## 2021-05-13 DIAGNOSIS — R293 Abnormal posture: Secondary | ICD-10-CM

## 2021-05-13 NOTE — Patient Instructions (Signed)
Access Code: [email or web address] URL: [email or web address] Date: 05/13/2021 Prepared by: Dwana Curd  Exercises Standing Hip Flexor Stretch - 3 x daily - 7 x weekly - 1 sets - 2 reps - 30 sec hold Seated Kegel - 3 x daily - 7 x weekly - 1 sets - 10 reps - 3 hold Seated Thoracic Flexion and Rotation with Arms Crossed - 1 x daily - 7 x weekly - 1 sets - 10 reps - 5 sec hold Hooklying Clamshells with Resistance - 1 x daily - 7 x weekly - 3 sets - 10 reps Mini Squat with Pelvic Floor Contraction - 1 x daily - 7 x weekly - 2 sets - 10 reps Standing Shoulder Flexion Reactive Isometrics with Elbow Extended - 1 x daily - 7 x weekly - 3 sets - 10 reps Shoulder extension with resistance - Neutral - 1 x daily - 7 x weekly - 3 sets - 10 reps Supine Hip Internal and External Rotation - 1 x daily - 7 x weekly - 1 sets - 10 reps - 5 sec hold Clam with Resistance - 1 x daily - 7 x weekly - 3 sets - 10 reps Standing Low Back Flexion at Table - 3 x daily - 7 x weekly - 1 sets - 8 reps Supine Diaphragmatic Breathing - 3 x daily - 7 x weekly - 10 reps - 1 sets Sidelying Hip Abduction - 1 x daily - 7 x weekly - 3 sets - 10 reps Supine Single Leg Lift - 1 x daily - 7 x weekly - 3 sets - 10 reps Sidelying Hip Adduction - 1 x daily - 7 x weekly - 3 sets - 10 reps

## 2021-05-15 NOTE — Therapy (Signed)
[facility] @ [place], [place], [address] Phone: [phone]   Fax:  [phone]  Physical Therapy Treatment  Patient Details  Name: Haley Buchanan MRN: [id number] Date of Birth: [birth year] Referring Provider (PT): Megan Salon, MD   Encounter Date: 05/13/2021   PT End of Session - 05/15/21 1946     Visit Number 3    Date for PT Re-Evaluation 07/22/21    Authorization Type UHC    PT Start Time 7588    PT Stop Time 1612    PT Time Calculation (min) 39 min    Activity Tolerance Patient tolerated treatment well    Behavior During Therapy [facility] for tasks assessed/performed             Past Medical History:  Diagnosis Date   Abnormal glandular Papanicolaou smear of cervix 06/2005   followed by LEEP, + HR HPV DNA 06/2008   Allergy    Anemia 09/2004   iron deficient   Anxiety    Arthritis, hip    Broken clavicle 10/23/2015   pt states she fell in bathroom    Depression    Last psychiatrist - Dr. Yves Dill - does not have records as seen prior to 2007   Foot fracture, left 11/2006   5th MTP   Lumbar herniated disc    Migraine    Osteoporosis 02/2006   DEXA 04/23/12 w/ T score L femur -2.8 and L spine -1.4   Thyroid disease 01/2007   subclinical hyperthyroidism   Von Willebrand's disease 03/2005    Past Surgical History:  Procedure Laterality Date   COLONOSCOPY N/A 10/15/2012   Procedure: COLONOSCOPY;  Surgeon: Juanita Craver, MD;  Location: [facility];  Service: Endoscopy;  Laterality: N/A;   COLPORRHAPHY  01/27/15   [place]oster[place]- [facility], Dr. Carlota Raspberry   LEEP  2007   LEEP N/A 05/06/2016   Procedure: LOOP ELECTROSURGICAL EXCISION PROCEDURE (LEEP) with colposcopy ;  Surgeon: Megan Salon, MD;  Location: [facility];  Service: Gynecology;  Laterality: N/A;   TMJ ARTHROSCOPY  1996   TONSILLECTOMY AND ADENOIDECTOMY      There were no vitals filed for this visit.   Subjective  Assessment - 05/15/21 1955     Subjective Not feeling any different but no leakage this week other than 1x.    Patient Stated Goals stop having leakage    Currently in Pain? No/denies                               [facility] Adult PT Treatment/Exercise - 05/15/21 0001       Lumbar Exercises: Aerobic   Elliptical 3/3 fwd and back      Lumbar Exercises: Standing   Other Standing Lumbar Exercises chair down dog; single leg stand hip abduction      Lumbar Exercises: Supine   Other Supine Lumbar Exercises hip abd, add, flexion 10x each way      Lumbar Exercises: Prone   Other Prone Lumbar Exercises prone kegel to see if can do without engaging gluteals as much - 10x                       PT Short Term Goals - 02/01/21 1544       PT SHORT TERM GOAL #1   Title ind with initial HEP    Status Achieved  PT Long Term Goals - 05/13/21 1537       PT LONG TERM GOAL #1   Title Pt will report no fecal leakage due to improved muscle tone and endurance    Baseline it has been really good lately this last week    Status Partially Met      PT LONG TERM GOAL #2   Title pt will be able to cough and sneeze without bladder leakage due to improve muscle coordination    Baseline just when sneezing or coughing not much    Status On-going      PT LONG TERM GOAL #3   Title Pt will be ind with advanced HEP    Status On-going      PT LONG TERM GOAL #4   Title Pt will have 5/5 bil hip strength to demonstrate improved posture with standing and walking    Status On-going                   Plan - 05/15/21 1958     Clinical Impression Statement Today's session focused on hip strength. Pt needs cues to keep pelvis stable when doing sidelying hip abduction. Pt needing cues with single leg to avoid dropping the hip into trendelenburg pattern. Pt is doing well with pelvic floor exercises and isolating those muscle. She will benefit from  skilled PT to continue to work on hip and core strength    Examination-Activity Limitations Toileting;Continence    PT Treatment/Interventions ADLs/Self Care Home Management;Biofeedback;Cryotherapy;Electrical Stimulation;Moist Heat;Neuromuscular re-education;Therapeutic exercise;Therapeutic activities;Patient/family education;Manual techniques;Passive range of motion;Taping;Dry needling    PT Next Visit Plan continue to progress core and pelvic floor strength as able, warm up nustep or eliptical    PT Home Exercise Plan Access Code: [email or web address]    Consulted and Agree with Plan of Care Patient             Patient will benefit from skilled therapeutic intervention in order to improve the following deficits and impairments:  Postural dysfunction, Decreased strength, Decreased endurance, Difficulty walking  Visit Diagnosis: Muscle weakness (generalized)  Abnormal posture     Problem List Patient Active Problem List   Diagnosis Date Noted   Abnormal thyroid blood test 02/03/2021   H/O LEEP 01/16/2021   Fecal smearing 01/16/2021   History of depression 01/16/2021   HSV infection 01/16/2021   Spondylolisthesis at L5-S1 level 10/28/2019   Von Willebrand disease 01/20/2015   Localized osteoporosis without current pathological fracture 02/04/2012   Headache, chronic daily 02/04/2012   Vaginal atrophy 07/14/2008    Jule Ser, PT 05/15/2021, 8:02 PM  [facility] @ [place], [place], [address] Phone: [phone]   Fax:  [phone]  Name: Haley Buchanan MRN: [id number] Date of Birth: 08-08-1955

## 2021-05-20 ENCOUNTER — Other Ambulatory Visit: Payer: Self-pay

## 2021-05-20 ENCOUNTER — Encounter: Payer: Self-pay | Admitting: Physical Therapy

## 2021-05-20 ENCOUNTER — Ambulatory Visit: Payer: No Typology Code available for payment source | Admitting: Physical Therapy

## 2021-05-20 DIAGNOSIS — M6281 Muscle weakness (generalized): Secondary | ICD-10-CM

## 2021-05-20 DIAGNOSIS — R293 Abnormal posture: Secondary | ICD-10-CM

## 2021-05-20 NOTE — Therapy (Signed)
[facility] @ [place], [place], [address] Phone: [phone]   Fax:  [phone]  Physical Therapy Treatment  Patient Details  Name: Haley Buchanan MRN: [id number] Date of Birth: [birth year] Referring Provider (PT): Megan Salon, MD   Encounter Date: 05/20/2021   PT End of Session - 05/20/21 0808     Visit Number 4    Date for PT Re-Evaluation 07/22/21    Authorization Type UHC    PT Start Time 0804    PT Stop Time 1173    PT Time Calculation (min) 40 min    Activity Tolerance Patient tolerated treatment well    Behavior During Therapy [facility] for tasks assessed/performed             Past Medical History:  Diagnosis Date   Abnormal glandular Papanicolaou smear of cervix 06/2005   followed by LEEP, + HR HPV DNA 06/2008   Allergy    Anemia 09/2004   iron deficient   Anxiety    Arthritis, hip    Broken clavicle 10/23/2015   pt states she fell in bathroom    Depression    Last psychiatrist - Dr. Yves Dill - does not have records as seen prior to 2007   Foot fracture, left 11/2006   5th MTP   Lumbar herniated disc    Migraine    Osteoporosis 02/2006   DEXA 04/23/12 w/ T score L femur -2.8 and L spine -1.4   Thyroid disease 01/2007   subclinical hyperthyroidism   Von Willebrand's disease 03/2005    Past Surgical History:  Procedure Laterality Date   COLONOSCOPY N/A 10/15/2012   Procedure: COLONOSCOPY;  Surgeon: Juanita Craver, MD;  Location: [facility];  Service: Endoscopy;  Laterality: N/A;   COLPORRHAPHY  01/27/15   [place]oster[place]- [facility], Dr. Carlota Raspberry   LEEP  2007   LEEP N/A 05/06/2016   Procedure: LOOP ELECTROSURGICAL EXCISION PROCEDURE (LEEP) with colposcopy ;  Surgeon: Megan Salon, MD;  Location: [facility];  Service: Gynecology;  Laterality: N/A;   TMJ ARTHROSCOPY  1996   TONSILLECTOMY AND ADENOIDECTOMY      There were no vitals filed for this visit.   Subjective  Assessment - 05/20/21 0811     Subjective Nothing new.  No leakage so that was good    Patient Stated Goals stop having leakage    Currently in Pain? No/denies                [facility] PT Assessment - 05/20/21 0001       Strength   Overall Strength Comments 4/5 bil hip abduction/adduction                           [facility] Adult PT Treatment/Exercise - 05/20/21 0001       Lumbar Exercises: Standing   Row Strengthening;Both;20 reps;Theraband    Theraband Level (Row) Level 3 (Green)    Shoulder Extension Strengthening;Both;20 reps;Theraband    Theraband Level (Shoulder Extension) Level 3 (Green)    Shoulder Extension Limitations pallof presses and rotation - 20x each green band    Other Standing Lumbar Exercises step up fwd and side 10x each slow with bracing      Lumbar Exercises: Supine   Other Supine Lumbar Exercises hip abd, add, flexion 15x each way    Other Supine Lumbar Exercises clam single leg yellow loop - 15 each side  Lumbar Exercises: Sidelying   Clam Both;20 reps   yellow loop                      PT Short Term Goals - 02/01/21 1544       PT SHORT TERM GOAL #1   Title ind with initial HEP    Status Achieved               PT Long Term Goals - 05/20/21 7955       PT LONG TERM GOAL #1   Title Pt will report no fecal leakage due to improved muscle tone and endurance    Status Achieved      PT LONG TERM GOAL #2   Title pt will be able to cough and sneeze without bladder leakage due to improve muscle coordination    Status Achieved      PT LONG TERM GOAL #3   Title Pt will be ind with advanced HEP    Status Achieved      PT LONG TERM GOAL #4   Title Pt will have 5/5 bil hip strength to demonstrate improved posture with standing and walking                   Plan - 05/20/21 0840     Clinical Impression Statement Pt has been doing well.  She has met most of her goals but still core and hip weakness is  present.  pt is expected to continue making progress with LE and core strengthening.  Pt will d/c with HEP today    PT Treatment/Interventions ADLs/Self Care Home Management;Biofeedback;Cryotherapy;Electrical Stimulation;Moist Heat;Neuromuscular re-education;Therapeutic exercise;Therapeutic activities;Patient/family education;Manual techniques;Passive range of motion;Taping;Dry needling    PT Next Visit Plan d/c    PT Home Exercise Plan Access Code: [email or web address]    Consulted and Agree with Plan of Care Patient             Patient will benefit from skilled therapeutic intervention in order to improve the following deficits and impairments:  Postural dysfunction, Decreased strength, Decreased endurance, Difficulty walking  Visit Diagnosis: Muscle weakness (generalized)  Abnormal posture     Problem List Patient Active Problem List   Diagnosis Date Noted   Abnormal thyroid blood test 02/03/2021   H/O LEEP 01/16/2021   Fecal smearing 01/16/2021   History of depression 01/16/2021   HSV infection 01/16/2021   Spondylolisthesis at L5-S1 level 10/28/2019   Von Willebrand disease 01/20/2015   Localized osteoporosis without current pathological fracture 02/04/2012   Headache, chronic daily 02/04/2012   Vaginal atrophy 07/14/2008    Jule Ser, PT 05/20/2021, 8:43 AM  [facility] @ [place], [place], [address] Phone: [phone]   Fax:  [phone]  Name: Haley Buchanan MRN: [id number] Date of Birth: [birth year]   PHYSICAL THERAPY DISCHARGE SUMMARY  Visits from Start of Care: 4  Current functional level related to goals / functional outcomes:   See above goals  Remaining deficits: see above details   Education / Equipment:  HEP  Patient agrees to discharge. Patient goals were partially met. Patient is being discharged due to being pleased with the current functional level.  Gustavus Bryant,  PT 05/20/21 8:43 AM

## 2021-06-09 IMAGING — CR DG LUMBAR SPINE 2-3V
3 series · 3 of 3 positions shown · non-contrast
Comparison: No pertinent prior studies available for comparison.

CLINICAL DATA: Right hip pain. Rule out lumbar degenerative disc
disease. Additional history provided by technologist: Patient
reports right hip pain for several weeks, no injury.

EXAM:
LUMBAR SPINE - 2-3 VIEW

[t l-spine a.p.]
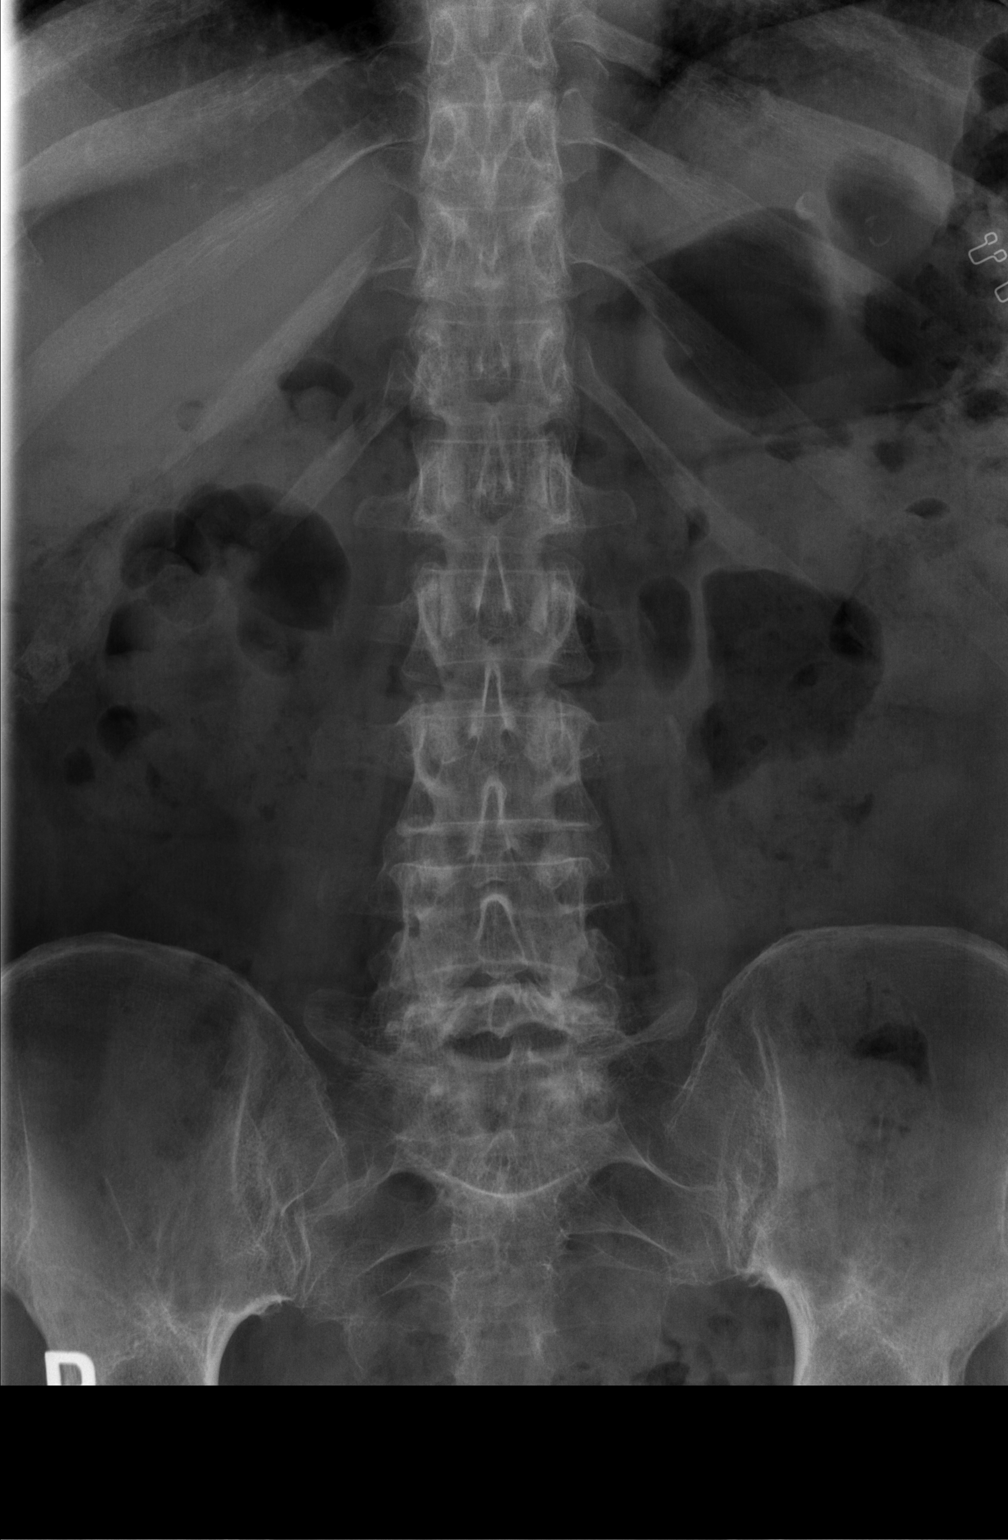

[t l-spine lat]
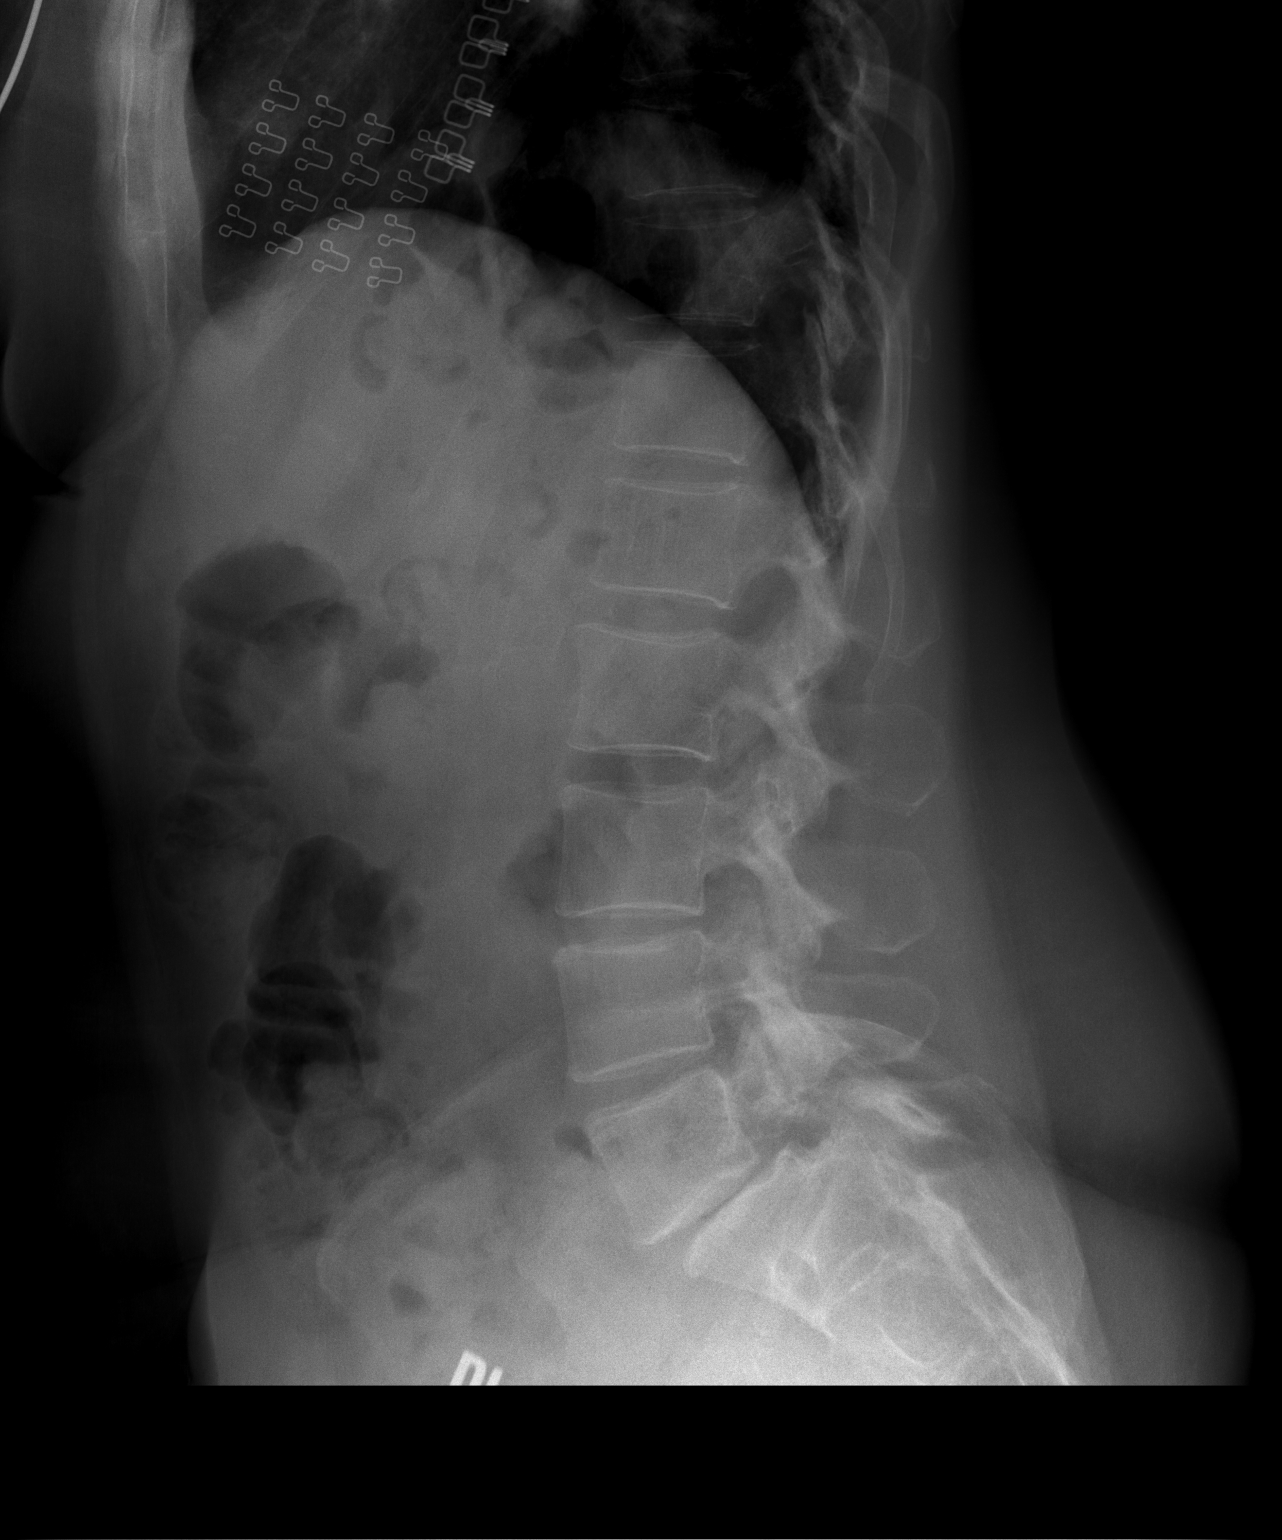

[t l-spine l5-s1 spot]
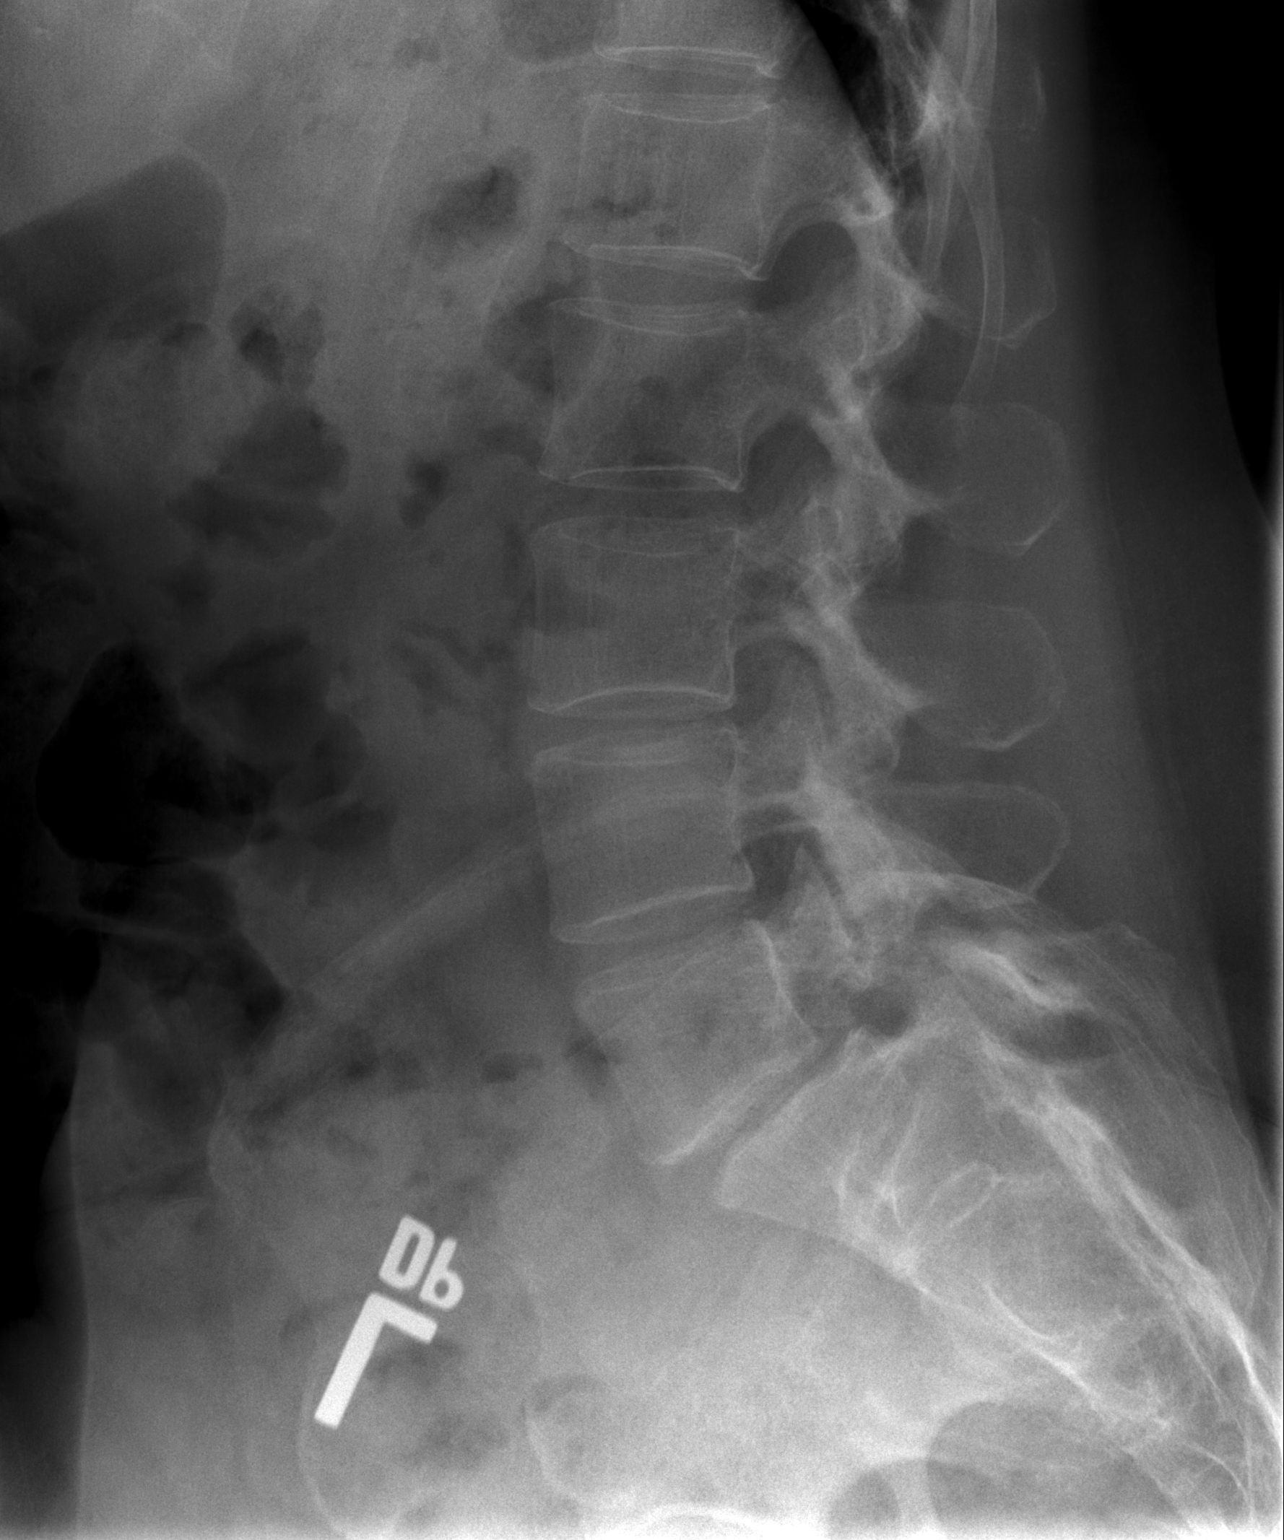

[3 of 3 positions shown; findings below may reference images not displayed]

FINDINGS: Five lumbar vertebrae. No lumbar compression deformity. 7 mm L5-S1
grade 1 anterolisthesis. Multilevel facet arthrosis greatest at
L4-L5 and L5-S1. L5 pars interarticularis defects are not
definitively identified on the current study, although are difficult
to exclude. Multilevel disc space narrowing. Most notably there is
moderate to advanced L5-S1 disc space narrowing.
IMPRESSION: Lumbar spondylosis as outlined and greatest at L5-S1.

At L5-S1 there is moderate to advanced disc degeneration. Facet
arthrosis with 7 mm grade 1 anterolisthesis. L5 pars
interarticularis defects are not definitively identified, although
are difficult to exclude.

## 2021-09-20 ENCOUNTER — Ambulatory Visit
Admission: RE | Admit: 2021-09-20 | Discharge: 2021-09-20 | Disposition: A | Discharge status: Home / Self Care | Payer: No Typology Code available for payment source | Source: Ambulatory Visit | Attending: Obstetrics & Gynecology | Admitting: Obstetrics & Gynecology

## 2021-09-20 DIAGNOSIS — M810 Age-related osteoporosis without current pathological fracture: Secondary | ICD-10-CM

## 2021-09-20 DIAGNOSIS — Z1231 Encounter for screening mammogram for malignant neoplasm of breast: Secondary | ICD-10-CM

## 2021-09-30 ENCOUNTER — Other Ambulatory Visit (HOSPITAL_BASED_OUTPATIENT_CLINIC_OR_DEPARTMENT_OTHER): Payer: Self-pay | Admitting: *Deleted

## 2021-09-30 DIAGNOSIS — M810 Age-related osteoporosis without current pathological fracture: Secondary | ICD-10-CM

## 2021-09-30 MED ORDER — ALENDRONATE SODIUM 70 MG PO TABS: ORAL_TABLET | ORAL | 4 refills | Status: AC

## 2022-01-27 ENCOUNTER — Ambulatory Visit (INDEPENDENT_AMBULATORY_CARE_PROVIDER_SITE_OTHER): Payer: Medicare Other | Admitting: Obstetrics & Gynecology

## 2022-01-27 ENCOUNTER — Encounter (HOSPITAL_BASED_OUTPATIENT_CLINIC_OR_DEPARTMENT_OTHER): Payer: Self-pay | Admitting: Obstetrics & Gynecology

## 2022-01-27 VITALS — BP 113/74 | HR 76 | Ht 64.0 in | Wt 169.6 lb

## 2022-01-27 DIAGNOSIS — Z9189 Other specified personal risk factors, not elsewhere classified: Secondary | ICD-10-CM | POA: Diagnosis not present

## 2022-01-27 DIAGNOSIS — Z9889 Other specified postprocedural states: Secondary | ICD-10-CM | POA: Diagnosis not present

## 2022-01-27 DIAGNOSIS — D6800 Von Willebrand disease, unspecified: Secondary | ICD-10-CM

## 2022-01-27 DIAGNOSIS — B009 Herpesviral infection, unspecified: Secondary | ICD-10-CM

## 2022-01-27 DIAGNOSIS — Z23 Encounter for immunization: Secondary | ICD-10-CM

## 2022-01-27 DIAGNOSIS — Z8659 Personal history of other mental and behavioral disorders: Secondary | ICD-10-CM | POA: Diagnosis not present

## 2022-01-27 DIAGNOSIS — M816 Localized osteoporosis [Lequesne]: Secondary | ICD-10-CM

## 2022-01-27 MED ORDER — FLUOXETINE HCL 40 MG PO CAPS: 40.0000 mg | ORAL_CAPSULE | Freq: Every day | ORAL | 4 refills | Status: DC

## 2022-01-27 MED ORDER — VALACYCLOVIR HCL 500 MG PO TABS: 500.0000 mg | ORAL_TABLET | Freq: Two times a day (BID) | ORAL | 1 refills | Status: AC

## 2022-01-27 NOTE — Progress Notes (Signed)
66 y.o. [id number] Married White or Caucasian female here for breast and pelvic exam.  I am also following her for osteoporosis.  She is on fosamax.  BMD this year was mildly improved.  Has been on Actonel/fosamax now more than 5 years.  Discussed having medication holiday for next two years.  Will plan BMD in about 2 years.    Denies vaginal bleeding.  Did do pelvic PT due to fecal incontinence.  This did stop but has started some vitamins and was having some issues.    Patient's last menstrual period was 02/17/2006.          Sexually active: Yes.    H/O STD:  h/o HR HPV  Health Maintenance: PCP:  Dr. Shon Baton.  Appt is scheduled for 03/01/2022.  Will do have blood work done a week earlier to appt.   Vaccines are up to date:  discussed with pt today Colonoscopy:  04/18/2020 with Dr. Collene Mares.  Follow up 5 years MMG:  09/20/2021 Negative BMD:  09/21/18/23 Last pap smear:  01/10/2020 Negative.   H/o abnormal pap smear:  yes, leep 2018   reports that she quit smoking about 22 years ago. Her smoking use included cigarettes. She has a 2.00 pack-year smoking history. She has never used smokeless tobacco. She reports current alcohol use of about 7.0 standard drinks of alcohol per week. She reports that she does not use drugs.  Past Medical History:  Diagnosis Date   Abnormal glandular Papanicolaou smear of cervix 06/2005   followed by LEEP, + HR HPV DNA 06/2008   Allergy    Anemia 09/2004   iron deficient   Anxiety    Arthritis, hip    Broken clavicle 10/23/2015   pt states she fell in bathroom    Depression    Last psychiatrist - Dr. Yves Dill - does not have records as seen prior to 2007   Foot fracture, left 11/2006   5th MTP   Lumbar herniated disc    Migraine    Osteoporosis 02/2006   DEXA 04/23/12 w/ T score L femur -2.8 and L spine -1.4   Thyroid disease 01/2007   subclinical hyperthyroidism   Von Willebrand's disease ([place]) 03/2005    Past Surgical History:  Procedure Laterality Date    COLONOSCOPY N/A 10/15/2012   Procedure: COLONOSCOPY;  Surgeon: Juanita Craver, MD;  Location: [facility];  Service: Endoscopy;  Laterality: N/A;   COLPORRHAPHY  01/27/15   Posterior- [place], Dr. Carlota Raspberry   LEEP  2007   LEEP N/A 05/06/2016   Procedure: LOOP ELECTROSURGICAL EXCISION PROCEDURE (LEEP) with colposcopy ;  Surgeon: Megan Salon, MD;  Location: [facility];  Service: Gynecology;  Laterality: N/A;   TMJ ARTHROSCOPY  1996   TONSILLECTOMY AND ADENOIDECTOMY      Current Outpatient Medications  Medication Sig Dispense Refill   alendronate (FOSAMAX) 70 MG tablet TAKE 1 TABLET BY MOUTH  EVERY 7 DAYS WITH A FULL  GLASS OF WATER ON AN EMPTY  STOMACH 12 tablet 4   ALPRAZolam (XANAX) 0.25 MG tablet Take 1 tablet (0.25 mg total) by mouth at bedtime as needed for anxiety. 30 tablet 0   aspirin-acetaminophen-caffeine (EXCEDRIN MIGRAINE) 250-250-65 MG tablet Take 1 tablet by mouth every 8 (eight) hours as needed for headache or migraine.      buPROPion (WELLBUTRIN XL) 150 MG 24 hr tablet Take 1 tablet by mouth daily.     conjugated estrogens (PREMARIN) 0.625 MG/GM vaginal cream daily as needed.  COVID-19 mRNA bivalent vaccine, Pfizer, (PFIZER COVID-19 VAC BIVALENT) injection Inject into the muscle. 0.3 mL 0   gabapentin (NEURONTIN) 300 MG capsule Take 2 capsules (600 mg) 3 times a day. 180 capsule 2   glucosamine-chondroitin 500-400 MG tablet Take 1 tablet by mouth 2 times daily at 12 noon and 4 pm.      hydrocortisone-pramoxine (ANALPRAM-HC) 2.5-1 % rectal cream Place rectally 3 (three) times daily. Apply as needed. 30 g 0   ibuprofen (ADVIL) 800 MG tablet Take 800 mg by mouth 3 (three) times daily.     methylphenidate (RITALIN) 20 MG tablet TK 1 T PO QAM.     Multiple Vitamin tablet Take 1 tablet by mouth daily.      pneumococcal 20-valent conjugate vaccine (PREVNAR 20) 0.5 ML injection Inject into the muscle. 0.5 mL 0   Pramoxine-HC (HYDROCORTISONE ACE-PRAMOXINE) 2.5-1 %  CREA Place rectally.     FLUoxetine (PROZAC) 40 MG capsule Take 1 capsule (40 mg total) by mouth daily. 90 capsule 4   valACYclovir (VALTREX) 500 MG tablet Take 1 tablet (500 mg total) by mouth 2 (two) times daily. Take BID x 3 days with symptoms. 30 tablet 1   No current facility-administered medications for this visit.    Family History  Problem Relation Age of Onset   Osteoporosis Mother    Heart Problems Mother    Thyroid disease Mother    Cancer Father    Ulcers Father    Alcohol abuse Father    Osteoporosis Maternal Grandmother    Alzheimer's disease Maternal Grandfather    Depression Daughter    Psychiatric Illness Brother     Review of Systems  Constitutional: Negative.   Genitourinary: Negative.     Exam:   BP 113/74 (BP Location: Right Arm, Patient Position: Sitting, Cuff Size: Large)   Pulse 76   Ht 5\' 4"  (1.626 m) Comment: Reported  Wt 169 lb 9.6 oz (76.9 kg)   LMP 02/17/2006   BMI 29.11 kg/m   Height: 5\' 4"  (162.6 cm) (Reported)  General appearance: alert, cooperative and appears stated age Breasts: normal appearance, no masses or tenderness Abdomen: soft, non-tender; bowel sounds normal; no masses,  no organomegaly Lymph nodes: Cervical, supraclavicular, and axillary nodes normal.  No abnormal inguinal nodes palpated Neurologic: Grossly normal  Pelvic: External genitalia:  no lesions              Urethra:  normal appearing urethra with no masses, tenderness or lesions              Bartholins and Skenes: normal                 Vagina: normal appearing vagina with atrophic changes and no discharge, no lesions              Cervix: no lesions              Pap taken: No. Bimanual Exam:  Uterus:  normal size, contour, position, consistency, mobility, non-tender              Adnexa: normal adnexa and no mass, fullness, tenderness               Rectovaginal: Confirms               Anus:  normal sphincter tone, no lesions  Chaperone, Octaviano Batty, CMA, was  present for exam.  Assessment/Plan: 1. GYN exam for high-risk Medicare patient - Pap smear neg with neg HR HPV  01/10/2020.  Will repeat next year. - Mammogram 09/20/2021 negative - Colonoscopy 04/18/2020 with Dr. Collene Mares.  Follow up 5 years. - Bone mineral density 09/20/2021 - lab work done with PCP - vaccines reviewed/updated  2. Influenza vaccination administered at current visit - Flu Vaccine QUAD High Dose(Fluad)  3. Localized osteoporosis without current pathological fracture - will start medication holiday for two years and then repeat BMD at that time  4. H/O LEEP - 2018.  Pap smears have been normal and HR HPV negative since that time  5. History of depression - off prozac.  I have written prozac for her in the past but has changed medication and has provider who is writing this now.    6. HSV infection - valACYclovir (VALTREX) 500 MG tablet; Take 1 tablet (500 mg total) by mouth 2 (two) times daily. Take BID x 3 days with symptoms.  Dispense: 30 tablet; Refill: 1  7. Von Willebrand disease ([place])

## 2022-01-27 NOTE — Patient Instructions (Addendum)
When you are ready to get a pnuemonia vaccination, start with Prenar 20  Shingrix.  Two shots 2-6 months apart.  RSV vaccine.   Please call [facility] and cancel the Fluoxetine (prozac)  I did not refill the alendronate (fosamax) as you are going to finish what you have and then have a medication holiday for about two years.

## 2022-01-31 ENCOUNTER — Encounter (HOSPITAL_BASED_OUTPATIENT_CLINIC_OR_DEPARTMENT_OTHER): Payer: Self-pay | Admitting: Obstetrics & Gynecology

## 2022-08-10 ENCOUNTER — Other Ambulatory Visit: Payer: Self-pay | Admitting: Family Medicine

## 2022-08-10 DIAGNOSIS — Z1231 Encounter for screening mammogram for malignant neoplasm of breast: Secondary | ICD-10-CM

## 2022-09-22 ENCOUNTER — Ambulatory Visit
Admission: RE | Admit: 2022-09-22 | Discharge: 2022-09-22 | Disposition: A | Discharge status: Home / Self Care | Payer: Medicare Other | Source: Ambulatory Visit | Attending: Family Medicine | Admitting: Family Medicine

## 2022-09-22 DIAGNOSIS — Z1231 Encounter for screening mammogram for malignant neoplasm of breast: Secondary | ICD-10-CM

## 2022-12-20 ENCOUNTER — Emergency Department (HOSPITAL_BASED_OUTPATIENT_CLINIC_OR_DEPARTMENT_OTHER)
Admission: EM | Admit: 2022-12-20 | Discharge: 2022-12-20 | Disposition: A | Discharge status: Home / Self Care | Payer: Medicare Other | Attending: Emergency Medicine | Admitting: Emergency Medicine

## 2022-12-20 ENCOUNTER — Emergency Department (HOSPITAL_BASED_OUTPATIENT_CLINIC_OR_DEPARTMENT_OTHER): Payer: Medicare Other

## 2022-12-20 DIAGNOSIS — E876 Hypokalemia: Secondary | ICD-10-CM | POA: Diagnosis not present

## 2022-12-20 DIAGNOSIS — R197 Diarrhea, unspecified: Secondary | ICD-10-CM | POA: Diagnosis present

## 2022-12-20 DIAGNOSIS — K529 Noninfective gastroenteritis and colitis, unspecified: Secondary | ICD-10-CM | POA: Diagnosis not present

## 2022-12-20 LAB — CBC
HCT: 35.8 % — ABNORMAL LOW (ref 36.0–46.0)
Hemoglobin: 11.7 g/dL — ABNORMAL LOW (ref 12.0–15.0)
MCH: 27.3 pg (ref 26.0–34.0)
MCHC: 32.7 g/dL (ref 30.0–36.0)
MCV: 83.6 fL (ref 80.0–100.0)
Platelets: 272 10*3/uL (ref 150–400)
RBC: 4.28 MIL/uL (ref 3.87–5.11)
RDW: 14.6 % (ref 11.5–15.5)
WBC: 8.9 10*3/uL (ref 4.0–10.5)
nRBC: 0 % (ref 0.0–0.2)

## 2022-12-20 LAB — COMPREHENSIVE METABOLIC PANEL
ALT: 14 U/L (ref 0–44)
AST: 13 U/L — ABNORMAL LOW (ref 15–41)
Albumin: 3.9 g/dL (ref 3.5–5.0)
Alkaline Phosphatase: 60 U/L (ref 38–126)
Anion gap: 11 (ref 5–15)
BUN: 12 mg/dL (ref 8–23)
CO2: 25 mmol/L (ref 22–32)
Calcium: 9.5 mg/dL (ref 8.9–10.3)
Chloride: 100 mmol/L (ref 98–111)
Creatinine, Ser: 1.02 mg/dL — ABNORMAL HIGH (ref 0.44–1.00)
GFR, Estimated: 60 mL/min — ABNORMAL LOW (ref >60)
Glucose, Bld: 106 mg/dL — ABNORMAL HIGH (ref 70–99)
Potassium: 2.9 mmol/L — ABNORMAL LOW (ref 3.5–5.1)
Sodium: 136 mmol/L (ref 135–145)
Total Bilirubin: 0.5 mg/dL (ref 0.3–1.2)
Total Protein: 6.8 g/dL (ref 6.5–8.1)

## 2022-12-20 LAB — LIPASE, BLOOD: Lipase: 26 U/L (ref 11–51)

## 2022-12-20 MED ORDER — SODIUM CHLORIDE 0.9 % IV BOLUS
1000.0000 mL | Freq: Once | INTRAVENOUS | Status: AC
  Administered 2022-12-20: 1000 mL via INTRAVENOUS

## 2022-12-20 MED ORDER — IOHEXOL 300 MG/ML  SOLN
100.0000 mL | Freq: Once | INTRAMUSCULAR | Status: AC | PRN
  Administered 2022-12-20: 80 mL via INTRAVENOUS

## 2022-12-20 MED ORDER — ACETAMINOPHEN 325 MG PO TABS
650.0000 mg | ORAL_TABLET | Freq: Once | ORAL | Status: AC
  Administered 2022-12-20: 650 mg via ORAL
  Filled 2022-12-20: qty 2

## 2022-12-20 MED ORDER — ACETAMINOPHEN 325 MG PO TABS
650.0000 mg | ORAL_TABLET | Freq: Once | ORAL | Status: AC
  Administered 2022-12-20: 325 mg via ORAL
  Filled 2022-12-20: qty 2

## 2022-12-20 MED ORDER — POTASSIUM CHLORIDE CRYS ER 20 MEQ PO TBCR
40.0000 meq | EXTENDED_RELEASE_TABLET | Freq: Once | ORAL | Status: AC
  Administered 2022-12-20: 40 meq via ORAL
  Filled 2022-12-20: qty 2

## 2022-12-20 MED ORDER — POTASSIUM CHLORIDE 10 MEQ/100ML IV SOLN
10.0000 meq | Freq: Once | INTRAVENOUS | Status: AC
  Administered 2022-12-20: 10 meq via INTRAVENOUS
  Filled 2022-12-20: qty 100

## 2022-12-20 MED ORDER — POTASSIUM CHLORIDE ER 20 MEQ PO TBCR
20.0000 meq | EXTENDED_RELEASE_TABLET | Freq: Every day | ORAL | 0 refills | Status: AC
Start: 2022-12-20 — End: 2022-12-27

## 2022-12-20 NOTE — ED Notes (Signed)
To CT

## 2022-12-20 NOTE — ED Triage Notes (Signed)
Pt POV reporting diarrhea since early September, seen at [facility] a few weeks ago, neg Cdiff. Went UC again today, told to go to ER for fluids, went to [facility], left due to wait times

## 2022-12-20 NOTE — ED Provider Notes (Signed)
[place] EMERGENCY DEPARTMENT AT [facility] Provider Note   CSN: [id number] Arrival date & time: 12/20/22  1745     History Chief Complaint  Patient presents with   Diarrhea    HPI Haley Buchanan is a 67 y.o. female presenting for further evaluation of a diarrhea that presented about 4 weeks ago. Has nausea and some vomiting sometimes. Has gone to Urgent care was advised to come to the ED. Went to [facility] ED but got tired of waiting at [facility] so she presented here today. She states her diarrhea is loose, floats, foul in smell and have an oily appearance. Nothing makes it better. Nothing makes it worse. It is intermittent. GI panel has been unremarkable in the past, including for C. Diff as she had been on antibiotics right before she experienced this episode. Diet consists of tea and toast some alcohol. Has not recently traveled, did not go camping. Has been in contact with Cats and Dogs. Does not coincide with eating. Does not find relief after defecation. Denies hematochezia. Has hx if iron deficiency anemia and had been on iron therapy until last month. Denies sick contacts. Retired, works at home.   No SOB, this morning started coughing made diarrhea worse. Chills last night. Has a fever. No CP, no chest palpitations. Feels miserable, not jittery. Does not feel like she has a lot of energy. Does NOT feel like her heart is going to come out of her chest.  Got a colonoscopy last year and only showed one polyp.   FH:  Mom heart issues  Dad stomach ulcers  Brother prostate cancer   Brother healthy  Sister healtlhy Maternal Grandmother healthy   Maternal Grandfather Alzheimer  Paternal Grandparents UNK   Patient's recorded medical, surgical, social, medication list and allergies were reviewed in the Snapshot window as part of the initial history.   Review of Systems   Negative unless stated above.  Physical Exam Updated Vital Signs BP (!) 103/57   Pulse 78    Temp (!) 100.6 F (38.1 C) (Oral)   Resp 17   LMP 02/17/2006   SpO2 99%  Physical Exam Constitutional:      General: She is not in acute distress.    Appearance: She is not ill-appearing or toxic-appearing.  Cardiovascular:     Rate and Rhythm: Normal rate and regular rhythm.     Heart sounds: S1 normal and S2 normal. Heart sounds not distant. No murmur heard.    No friction rub. No gallop.  Pulmonary:     Effort: No respiratory distress.     Breath sounds: No decreased breath sounds, wheezing, rhonchi or rales.  Abdominal:     General: Abdomen is flat. Bowel sounds are normal. There is no distension.     Palpations: Abdomen is soft. There is no hepatomegaly or splenomegaly.     Tenderness: There is no abdominal tenderness. There is no right CVA tenderness, left CVA tenderness, guarding or rebound.  Musculoskeletal:     Right lower leg: No edema.     Left lower leg: No edema.  Skin:    General: Skin is warm and dry.     Coloration: Skin is not pale.     Findings: No rash.  Neurological:     Mental Status: She is alert.    ED Course/ Medical Decision Making/ A&P    Medications Ordered in ED Medications  acetaminophen (TYLENOL) tablet 650 mg (650 mg Oral Given 12/20/22 1815)  potassium  chloride SA (KLOR-CON M) CR tablet 40 mEq (40 mEq Oral Given 12/20/22 1912)  sodium chloride 0.9 % bolus 1,000 mL (0 mLs Intravenous Stopped 12/20/22 2130)  acetaminophen (TYLENOL) tablet 650 mg (325 mg Oral Given 12/20/22 2005)  potassium chloride 10 mEq in 100 mL IVPB (0 mEq Intravenous Stopped 12/20/22 2130)  iohexol (OMNIPAQUE) 300 MG/ML solution 100 mL (80 mLs Intravenous Contrast Given 12/20/22 2050)    Medical Decision Making:    Haley Buchanan is a 68 y.o. female who presented to the ED today with diarrhea that has been present for over a month.   Complete initial physical exam performed, notably the patient was weak but had no abdominal tenderness.    Reviewed and confirmed  nursing documentation for past medical history, family history, social history.    Initial Assessment:   With the patient's presentation of  diarrhea, negative GI panel, COVID, RSV, Flu negative. Her history is more consistent with steatorrhea. May have IBS or colitis. May need GI evaluation   Initial Plan:  CT A/P Screening labs including CBC and Metabolic panel to evaluate for infectious or metabolic etiology of disease.  Objective evaluation as below reviewed with plan for close reassessment  Initial Study Results:   Laboratory  All laboratory results reviewed without evidence of clinically relevant pathology.    Patient was hypokalemic. WBC WNL Cr. Increased at 1.02   Radiology  All images reviewed independently. Agree with radiology report at this time.    CT ABDOMEN PELVIS W CONTRAST  Result Date: 12/20/2022 CLINICAL DATA:  Diarrhea. EXAM: CT ABDOMEN AND PELVIS WITH CONTRAST TECHNIQUE: Multidetector CT imaging of the abdomen and pelvis was performed using the standard protocol following bolus administration of intravenous contrast. RADIATION DOSE REDUCTION: This exam was performed according to the departmental dose-optimization program which includes automated exposure control, adjustment of the mA and/or kV according to patient size and/or use of iterative reconstruction technique. CONTRAST:  80mL OMNIPAQUE IOHEXOL 300 MG/ML  SOLN COMPARISON:  None Available. FINDINGS: Lower chest: No acute abnormality. Hepatobiliary: No focal liver abnormality is seen. A gallbladder fold is suspected within the anterior aspect of the gallbladder fundus. No gallstones, gallbladder wall thickening, or biliary dilatation. Pancreas: Unremarkable. No pancreatic ductal dilatation or surrounding inflammatory changes. Spleen: Normal in size without focal abnormality. Adrenals/Urinary Tract: Adrenal glands are unremarkable. Kidneys are normal in size, without renal calculi or hydronephrosis. Small parapelvic  renal cysts are seen within the left kidney. Bladder is unremarkable. Stomach/Bowel: There is a small hiatal hernia. Appendix appears normal. No evidence of bowel dilatation. Poorly distended, mild to moderately thickened and inflamed large bowel loops are seen involving the mid to distal transverse colon, descending colon and sigmoid colon. Vascular/Lymphatic: Aortic atherosclerosis. No enlarged abdominal or pelvic lymph nodes. Reproductive: Uterus and bilateral adnexa are unremarkable. Other: No abdominal wall hernia or abnormality. No abdominopelvic ascites. Musculoskeletal: Marked severity degenerative changes are seen at the level of L5-S1, with grade 1 anterolisthesis of the L5 vertebral body on S1. IMPRESSION: 1. Findings consistent with mild to moderate colitis involving the mid to distal transverse colon, descending colon and sigmoid colon. 2. Small hiatal hernia. 3. Marked severity degenerative changes at the level of L5-S1, with grade 1 anterolisthesis of the L5 vertebral body on S1. 4. Aortic atherosclerosis. Aortic Atherosclerosis (ICD10-I70.0). Electronically Signed   By: Aram Candela M.D.   On: 12/20/2022 21:34     Reassessment and Plan:    Patient has findings with colitis. May have IBS. Will  need further evaluation by GI. Has been present for over four weeks and is hypokalemic today. Received repletion. Discharging her on of Potassium to take for 7 days and FU with PCP for BMP. Has previously seen Dr. Loreta Ave. Will refer to him.   Clinical Impression:  1. Colitis   2. Hypokalemia      Discharge   Final Clinical Impression(s) / ED Diagnoses Final diagnoses:  Colitis  Hypokalemia    Rx / DC Orders ED Discharge Orders          Ordered    potassium chloride 20 MEQ TBCR  Daily        12/20/22 2211              Hassan Rowan, [place], MD 12/20/22 2337    Charlynne Pander, MD 12/21/22 [phone]

## 2022-12-20 NOTE — Discharge Instructions (Addendum)
Thank you for letting us take care of you today.    You were evaluated for diarrhea that has been going on for over a week. Your imaging studies do confirm that you have inflammation of your colon. Please follow up with Dr. Loreta Ave for Gastroenterology.  Your potassium was very low. You got potassium during your visit but you will need to take potassium pills for one week and follow up with your PCP for a repeat blood draw.

## 2023-11-23 ENCOUNTER — Other Ambulatory Visit: Payer: Self-pay | Admitting: Family Medicine

## 2023-11-23 DIAGNOSIS — Z1231 Encounter for screening mammogram for malignant neoplasm of breast: Secondary | ICD-10-CM

## 2023-12-06 ENCOUNTER — Ambulatory Visit
Admission: RE | Admit: 2023-12-06 | Discharge: 2023-12-06 | Disposition: A | Discharge status: Home / Self Care | Source: Ambulatory Visit | Attending: Family Medicine | Admitting: Family Medicine

## 2023-12-06 DIAGNOSIS — Z1231 Encounter for screening mammogram for malignant neoplasm of breast: Secondary | ICD-10-CM
